# Patient Record
Sex: Male | Born: 1960 | Race: White | Hispanic: No | Marital: Single | Smoking: Never smoker
Health system: Southern US, Community
[De-identification: ages and names within clinical notes are randomized; demographics above are authoritative.]

## PROBLEM LIST (undated history)

## (undated) DIAGNOSIS — IMO0001 Reserved for inherently not codable concepts without codable children: Secondary | ICD-10-CM

## (undated) DIAGNOSIS — Z639 Problem related to primary support group, unspecified: Secondary | ICD-10-CM

## (undated) DIAGNOSIS — G4700 Insomnia, unspecified: Secondary | ICD-10-CM

## (undated) DIAGNOSIS — F32A Depression, unspecified: Secondary | ICD-10-CM

## (undated) DIAGNOSIS — K219 Gastro-esophageal reflux disease without esophagitis: Secondary | ICD-10-CM

## (undated) DIAGNOSIS — M1990 Unspecified osteoarthritis, unspecified site: Secondary | ICD-10-CM

## (undated) DIAGNOSIS — G250 Essential tremor: Secondary | ICD-10-CM

## (undated) DIAGNOSIS — F419 Anxiety disorder, unspecified: Secondary | ICD-10-CM

## (undated) DIAGNOSIS — R42 Dizziness and giddiness: Secondary | ICD-10-CM

## (undated) HISTORY — DX: Anxiety disorder, unspecified: F41.9

## (undated) HISTORY — DX: Gastro-esophageal reflux disease without esophagitis: K21.9

## (undated) HISTORY — DX: Unspecified osteoarthritis, unspecified site: M19.90

## (undated) HISTORY — DX: Insomnia, unspecified: G47.00

## (undated) HISTORY — DX: Essential tremor: G25.0

## (undated) HISTORY — DX: Depression, unspecified: F32.A

## (undated) HISTORY — DX: Dizziness and giddiness: R42

## (undated) HISTORY — DX: Problem related to primary support group, unspecified: Z63.9

---

## 2004-12-26 ENCOUNTER — Emergency Department (HOSPITAL_COMMUNITY): Admission: EM | Admit: 2004-12-26 | Discharge: 2004-12-26 | Payer: Self-pay | Admitting: Emergency Medicine

## 2008-06-26 ENCOUNTER — Emergency Department (HOSPITAL_COMMUNITY): Admission: EM | Admit: 2008-06-26 | Discharge: 2008-06-26 | Payer: Self-pay | Admitting: Emergency Medicine

## 2010-03-20 ENCOUNTER — Emergency Department (HOSPITAL_COMMUNITY): Admission: EM | Admit: 2010-03-20 | Discharge: 2010-03-20 | Payer: Self-pay | Admitting: Family Medicine

## 2010-03-21 ENCOUNTER — Emergency Department (HOSPITAL_COMMUNITY): Admission: EM | Admit: 2010-03-21 | Discharge: 2010-03-21 | Payer: Self-pay | Admitting: Emergency Medicine

## 2012-04-03 ENCOUNTER — Emergency Department (HOSPITAL_COMMUNITY): Payer: Self-pay

## 2012-04-03 ENCOUNTER — Observation Stay (HOSPITAL_COMMUNITY): Payer: Self-pay

## 2012-04-03 ENCOUNTER — Observation Stay (HOSPITAL_COMMUNITY): Payer: Self-pay | Attending: General Surgery

## 2012-04-03 ENCOUNTER — Observation Stay (HOSPITAL_COMMUNITY)
Admission: EM | Admit: 2012-04-03 | Discharge: 2012-04-05 | Disposition: A | Discharge status: Home / Self Care | Payer: Self-pay | Source: Ambulatory Visit | Attending: Emergency Medicine | Admitting: Emergency Medicine

## 2012-04-03 ENCOUNTER — Encounter (HOSPITAL_COMMUNITY): Payer: Self-pay | Admitting: Emergency Medicine

## 2012-04-03 DIAGNOSIS — F10929 Alcohol use, unspecified with intoxication, unspecified: Secondary | ICD-10-CM | POA: Diagnosis present

## 2012-04-03 DIAGNOSIS — D62 Acute posthemorrhagic anemia: Secondary | ICD-10-CM | POA: Diagnosis not present

## 2012-04-03 DIAGNOSIS — S060X9A Concussion with loss of consciousness of unspecified duration, initial encounter: Secondary | ICD-10-CM

## 2012-04-03 DIAGNOSIS — S060X0A Concussion without loss of consciousness, initial encounter: Secondary | ICD-10-CM | POA: Insufficient documentation

## 2012-04-03 DIAGNOSIS — F1010 Alcohol abuse, uncomplicated: Secondary | ICD-10-CM | POA: Insufficient documentation

## 2012-04-03 DIAGNOSIS — IMO0001 Reserved for inherently not codable concepts without codable children: Secondary | ICD-10-CM | POA: Insufficient documentation

## 2012-04-03 DIAGNOSIS — S41109A Unspecified open wound of unspecified upper arm, initial encounter: Secondary | ICD-10-CM

## 2012-04-03 DIAGNOSIS — S060XAA Concussion with loss of consciousness status unknown, initial encounter: Secondary | ICD-10-CM | POA: Diagnosis present

## 2012-04-03 DIAGNOSIS — S0990XA Unspecified injury of head, initial encounter: Secondary | ICD-10-CM

## 2012-04-03 DIAGNOSIS — Z23 Encounter for immunization: Secondary | ICD-10-CM | POA: Insufficient documentation

## 2012-04-03 DIAGNOSIS — Y92410 Unspecified street and highway as the place of occurrence of the external cause: Secondary | ICD-10-CM | POA: Insufficient documentation

## 2012-04-03 DIAGNOSIS — S41111A Laceration without foreign body of right upper arm, initial encounter: Secondary | ICD-10-CM | POA: Diagnosis present

## 2012-04-03 HISTORY — DX: Reserved for inherently not codable concepts without codable children: IMO0001

## 2012-04-03 LAB — RAPID URINE DRUG SCREEN, HOSP PERFORMED
Amphetamines: NOT DETECTED
Benzodiazepines: NOT DETECTED

## 2012-04-03 LAB — URINALYSIS, MICROSCOPIC ONLY
Bilirubin Urine: NEGATIVE
Glucose, UA: NEGATIVE mg/dL
Ketones, ur: NEGATIVE mg/dL
pH: 5 (ref 5.0–8.0)

## 2012-04-03 LAB — CDS SEROLOGY

## 2012-04-03 LAB — CBC
HCT: 36.9 % — ABNORMAL LOW (ref 39.0–52.0)
Hemoglobin: 13 g/dL (ref 13.0–17.0)
MCH: 33.2 pg (ref 26.0–34.0)
MCV: 94.1 fL (ref 78.0–100.0)
RBC: 3.92 MIL/uL — ABNORMAL LOW (ref 4.22–5.81)

## 2012-04-03 LAB — POCT I-STAT, CHEM 8
BUN: 15 mg/dL (ref 6–23)
Calcium, Ion: 1.09 mmol/L — ABNORMAL LOW (ref 1.12–1.32)
Creatinine, Ser: 1.3 mg/dL (ref 0.50–1.35)
Hemoglobin: 13.3 g/dL (ref 13.0–17.0)
TCO2: 19 mmol/L (ref 0–100)

## 2012-04-03 LAB — TYPE AND SCREEN: Unit division: 0

## 2012-04-03 LAB — COMPREHENSIVE METABOLIC PANEL
AST: 27 U/L (ref 0–37)
Albumin: 4.4 g/dL (ref 3.5–5.2)
Calcium: 8.8 mg/dL (ref 8.4–10.5)
Chloride: 101 mEq/L (ref 96–112)
Creatinine, Ser: 0.9 mg/dL (ref 0.50–1.35)
Total Bilirubin: 0.2 mg/dL — ABNORMAL LOW (ref 0.3–1.2)
Total Protein: 7.2 g/dL (ref 6.0–8.3)

## 2012-04-03 LAB — PROTIME-INR: Prothrombin Time: 14 seconds (ref 11.6–15.2)

## 2012-04-03 LAB — ETHANOL: Alcohol, Ethyl (B): 272 mg/dL — ABNORMAL HIGH (ref 0–11)

## 2012-04-03 LAB — LACTIC ACID, PLASMA: Lactic Acid, Venous: 3.2 mmol/L — ABNORMAL HIGH (ref 0.5–2.2)

## 2012-04-03 MED ORDER — OXYCODONE HCL 5 MG PO TABS
5.0000 mg | ORAL_TABLET | ORAL | Status: DC | PRN
  Administered 2012-04-03 – 2012-04-04 (×2): 10 mg via ORAL
  Filled 2012-04-03 (×2): qty 2

## 2012-04-03 MED ORDER — HYDROMORPHONE HCL PF 1 MG/ML IJ SOLN
1.0000 mg | INTRAMUSCULAR | Status: DC | PRN
  Administered 2012-04-03 – 2012-04-04 (×4): 1 mg via INTRAVENOUS
  Filled 2012-04-03 (×5): qty 1

## 2012-04-03 MED ORDER — PANTOPRAZOLE SODIUM 40 MG IV SOLR
40.0000 mg | Freq: Every day | INTRAVENOUS | Status: DC
  Filled 2012-04-03 (×2): qty 40

## 2012-04-03 MED ORDER — ALUM & MAG HYDROXIDE-SIMETH 200-200-20 MG/5ML PO SUSP
30.0000 mL | Freq: Four times a day (QID) | ORAL | Status: DC | PRN
  Administered 2012-04-03: 30 mL via ORAL
  Filled 2012-04-03: qty 30

## 2012-04-03 MED ORDER — PROMETHAZINE HCL 25 MG/ML IJ SOLN
12.5000 mg | Freq: Four times a day (QID) | INTRAMUSCULAR | Status: DC | PRN
  Filled 2012-04-03: qty 1

## 2012-04-03 MED ORDER — VITAMIN B-1 100 MG PO TABS
100.0000 mg | ORAL_TABLET | Freq: Every day | ORAL | Status: DC
  Administered 2012-04-03 – 2012-04-05 (×3): 100 mg via ORAL
  Filled 2012-04-03 (×3): qty 1

## 2012-04-03 MED ORDER — POTASSIUM CHLORIDE CRYS ER 20 MEQ PO TBCR
20.0000 meq | EXTENDED_RELEASE_TABLET | Freq: Three times a day (TID) | ORAL | Status: AC
  Administered 2012-04-03 (×3): 20 meq via ORAL
  Filled 2012-04-03 (×3): qty 1

## 2012-04-03 MED ORDER — HYDROMORPHONE HCL PF 1 MG/ML IJ SOLN
0.5000 mg | INTRAMUSCULAR | Status: DC | PRN
  Administered 2012-04-03 (×3): 0.5 mg via INTRAVENOUS
  Filled 2012-04-03 (×3): qty 1

## 2012-04-03 MED ORDER — ONDANSETRON HCL 4 MG PO TABS: 4.0000 mg | ORAL_TABLET | Freq: Four times a day (QID) | ORAL | Status: DC | PRN

## 2012-04-03 MED ORDER — FENTANYL CITRATE 0.05 MG/ML IJ SOLN
INTRAMUSCULAR | Status: AC
  Administered 2012-04-03: 100 ug via INTRAVENOUS
  Filled 2012-04-03: qty 2

## 2012-04-03 MED ORDER — PANTOPRAZOLE SODIUM 40 MG PO TBEC
40.0000 mg | DELAYED_RELEASE_TABLET | Freq: Every day | ORAL | Status: DC
  Administered 2012-04-03: 40 mg via ORAL
  Filled 2012-04-03: qty 1

## 2012-04-03 MED ORDER — ADULT MULTIVITAMIN W/MINERALS CH
1.0000 | ORAL_TABLET | Freq: Every day | ORAL | Status: DC
  Administered 2012-04-03 – 2012-04-05 (×3): 1 via ORAL
  Filled 2012-04-03 (×3): qty 1

## 2012-04-03 MED ORDER — ONDANSETRON HCL 4 MG/2ML IJ SOLN
4.0000 mg | Freq: Four times a day (QID) | INTRAMUSCULAR | Status: DC | PRN
  Administered 2012-04-03 – 2012-04-04 (×5): 4 mg via INTRAVENOUS
  Filled 2012-04-03 (×5): qty 2

## 2012-04-03 MED ORDER — DEXTROSE IN LACTATED RINGERS 5 % IV SOLN
INTRAVENOUS | Status: DC
  Administered 2012-04-03 (×3): via INTRAVENOUS

## 2012-04-03 MED ORDER — TETANUS-DIPHTH-ACELL PERTUSSIS 5-2.5-18.5 LF-MCG/0.5 IM SUSP
0.5000 mL | Freq: Once | INTRAMUSCULAR | Status: AC
  Administered 2012-04-03: 0.5 mL via INTRAMUSCULAR
  Filled 2012-04-03: qty 0.5

## 2012-04-03 MED ORDER — OXYCODONE HCL 5 MG PO TABS
5.0000 mg | ORAL_TABLET | ORAL | Status: DC | PRN
  Administered 2012-04-03 (×2): 5 mg via ORAL
  Filled 2012-04-03 (×2): qty 1

## 2012-04-03 MED ORDER — FOLIC ACID 1 MG PO TABS
1.0000 mg | ORAL_TABLET | Freq: Every day | ORAL | Status: DC
  Administered 2012-04-03 – 2012-04-05 (×3): 1 mg via ORAL
  Filled 2012-04-03 (×3): qty 1

## 2012-04-03 MED ORDER — LORAZEPAM 1 MG PO TABS
1.0000 mg | ORAL_TABLET | Freq: Four times a day (QID) | ORAL | Status: DC | PRN
  Administered 2012-04-03: 1 mg via ORAL
  Filled 2012-04-03 (×2): qty 1

## 2012-04-03 MED ORDER — TETANUS-DIPHTHERIA TOXOIDS TD 5-2 LFU IM INJ: 0.5000 mL | INJECTION | Freq: Once | INTRAMUSCULAR | Status: DC

## 2012-04-03 MED ORDER — THIAMINE HCL 100 MG/ML IJ SOLN
100.0000 mg | Freq: Every day | INTRAMUSCULAR | Status: DC
  Filled 2012-04-03 (×3): qty 1

## 2012-04-03 MED ORDER — IOHEXOL 300 MG/ML  SOLN
100.0000 mL | Freq: Once | INTRAMUSCULAR | Status: AC | PRN
  Administered 2012-04-03: 100 mL via INTRAVENOUS

## 2012-04-03 MED ORDER — LORAZEPAM 2 MG/ML IJ SOLN: 1.0000 mg | Freq: Four times a day (QID) | INTRAMUSCULAR | Status: DC | PRN
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
## 2012-04-03 NOTE — ED Notes (Signed)
Patient was transported to CT on portable cardiac monitor with RN and nurse tech; patient now back from CT.  Moved to room 02 (OKed by Dr. Janee Morn).

## 2012-04-03 NOTE — Procedures (Signed)
Procedure note Preoperative diagnosis: 10 cm laceration right posterior arm Postoperative diagnosis: 10 cm laceration right posterior arm Procedure: Irrigation and simple closure 10 cm laceration right posterior arm Surgeon: Violeta Gelinas M.D. Anesthesia: Local Procedure: Emergency consent was obtained, x-ray reviewed and show no fracture. Laceration was copiously irrigated with Betadine and saline. Laceration was in a wide V-shaped over the triceps. There is minimal injury to the triceps fascia but no significant disruption. No active bleeding. Wound was closed with staples and a simple fashion. Next several long wicks of quarter inch iodoform gauze were placed. Next a sterile gauze and Kerlix dressing was applied. Patient tolerated procedure well. Violeta Gelinas, MD, MPH, FACS Pager: [phone]

## 2012-04-03 NOTE — ED Notes (Signed)
X-ray at bedside

## 2012-04-03 NOTE — ED Notes (Signed)
Patient being transported upstairs on portable cardiac monitor with RN. 

## 2012-04-03 NOTE — ED Notes (Signed)
Dr. Janee Morn at bedside with suture materials.
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
## 2012-04-03 NOTE — ED Notes (Signed)
Patient currently asleep in bed; no respiratory or acute distress noted.  Will continue to monitor. 

## 2012-04-03 NOTE — ED Notes (Signed)
Calling report now. 
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
## 2012-04-04 DIAGNOSIS — F10929 Alcohol use, unspecified with intoxication, unspecified: Secondary | ICD-10-CM | POA: Diagnosis present

## 2012-04-04 DIAGNOSIS — S41111A Laceration without foreign body of right upper arm, initial encounter: Secondary | ICD-10-CM | POA: Diagnosis present

## 2012-04-04 DIAGNOSIS — S060XAA Concussion with loss of consciousness status unknown, initial encounter: Secondary | ICD-10-CM | POA: Diagnosis present

## 2012-04-04 DIAGNOSIS — D62 Acute posthemorrhagic anemia: Secondary | ICD-10-CM | POA: Diagnosis not present

## 2012-04-04 LAB — CBC
HCT: 30.2 % — ABNORMAL LOW (ref 39.0–52.0)
MCHC: 34.4 g/dL (ref 30.0–36.0)
MCV: 96.2 fL (ref 78.0–100.0)
RDW: 12.6 % (ref 11.5–15.5)

## 2012-04-04 LAB — BASIC METABOLIC PANEL
BUN: 8 mg/dL (ref 6–23)
CO2: 27 mEq/L (ref 19–32)
Chloride: 102 mEq/L (ref 96–112)
Creatinine, Ser: 0.7 mg/dL (ref 0.50–1.35)
GFR calc Af Amer: >90 mL/min (ref >90)

## 2012-04-04 MED ORDER — ENOXAPARIN SODIUM 30 MG/0.3ML ~~LOC~~ SOLN
30.0000 mg | Freq: Two times a day (BID) | SUBCUTANEOUS | Status: DC
  Administered 2012-04-04 – 2012-04-05 (×3): 30 mg via SUBCUTANEOUS
  Filled 2012-04-04 (×4): qty 0.3

## 2012-04-04 MED ORDER — HYDROMORPHONE HCL PF 1 MG/ML IJ SOLN
0.5000 mg | INTRAMUSCULAR | Status: DC | PRN
  Administered 2012-04-04: 0.5 mg via INTRAVENOUS
  Filled 2012-04-04: qty 1

## 2012-04-04 MED ORDER — OXYCODONE HCL 5 MG PO TABS
5.0000 mg | ORAL_TABLET | ORAL | Status: DC | PRN
  Administered 2012-04-04: 10 mg via ORAL
  Administered 2012-04-04 – 2012-04-05 (×6): 15 mg via ORAL
  Filled 2012-04-04 (×6): qty 3
  Filled 2012-04-04: qty 2

## 2012-04-04 MED ORDER — BACITRACIN ZINC 500 UNIT/GM EX OINT
1.0000 "application " | TOPICAL_OINTMENT | Freq: Two times a day (BID) | CUTANEOUS | Status: DC
  Administered 2012-04-04 – 2012-04-05 (×3): 1 via TOPICAL
  Filled 2012-04-04: qty 15
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
## 2012-04-04 NOTE — Progress Notes (Signed)
PT Cancellation Note  Treatment cancelled today due to patient just eating breakfast, PT to check back later this PM for gait and mobility.  Patient is agreeable.  [name] B. Zarinah Oviatt, PT, DPT [phone] 04/04/2012, 12:05 PM
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
## 2012-04-04 NOTE — Progress Notes (Signed)
Patient is becoming more mobile.  CPM.  This patient has been seen and I agree with the findings and treatment plan.  Marta Lamas. Gae Bon, MD, FACS [phone] (pager) [phone] (direct pager) Trauma Surgeon

## 2012-04-05 LAB — CBC
MCH: 32.6 pg (ref 26.0–34.0)
MCHC: 33.4 g/dL (ref 30.0–36.0)
Platelets: 292 10*3/uL (ref 150–400)
RDW: 12.4 % (ref 11.5–15.5)

## 2012-04-05 MED ORDER — OXYCODONE-ACETAMINOPHEN 7.5-325 MG PO TABS: 1.0000 | ORAL_TABLET | ORAL | Status: AC | PRN

## 2012-04-05 NOTE — Discharge Instructions (Signed)
Wash wound daily in shower with soap and water.  Do not soak. Apply antibiotic ointment twice daily and as needed to keep moist. Cover with a dry dressing.  No driving while on Percocet (oxycodone).

## 2012-04-05 NOTE — Discharge Summary (Signed)
SL x 2 removed, no bleeding noted, catheter tips intact.  Pt seen by Phsical Therapy and cleared.  Pt given d/c instructionsl along with f/u apt to be made and prescription for percocet.  Pt denies any needs.  Pt d/c'd home via w/c accompanied by medical staff and significant other.

## 2012-04-05 NOTE — Progress Notes (Signed)
UR complete 

## 2012-04-05 NOTE — Progress Notes (Signed)
Physical Therapy Treatment Patient Details Name: Jeffery Goodman MRN: [id number] DOB: 1961-10-27 Today's Date: 04/05/2012  PT Assessment/Plan  PT - Assessment/Plan Comments on Treatment Session: Pt amb WNL. Pt had no isssues with LOB or lightheadness with dynamic gait activities during amb.  PT Plan: All goals met and education completed, patient dischaged from PT services PT Frequency: Min 5X/week Follow Up Recommendations: No PT follow up Equipment Recommended: None recommended by PT PT Goals  Acute Rehab PT Goals PT Goal: Supine/Side to Sit - Progress: Met PT Goal: Sit to Supine/Side - Progress: Met PT Goal: Sit to Stand - Progress: Met PT Goal: Stand to Sit - Progress: Met PT Transfer Goal: Bed to Chair/Chair to Bed - Progress: Met PT Goal: Ambulate - Progress: Met PT Goal: Up/Down Stairs - Progress: Met  PT Treatment Precautions/Restrictions  Precautions Precautions: Fall Restrictions Weight Bearing Restrictions: No Mobility (including Balance) Bed Mobility Supine to Sit: 7: Independent Sitting - Scoot to Edge of Bed: 7: Independent Transfers Transfers: Yes Sit to Stand: 7: Independent Stand to Sit: 7: Independent Ambulation/Gait Ambulation/Gait: Yes Ambulation/Gait Assistance Details (indicate cue type and reason): Pt amb with no LOB or lightheadness.  Ambulation Distance (Feet): 400 Feet Assistive device: None Gait Pattern: Within Functional Limits;Step-through pattern Stairs: Yes Stair Management Technique: No rails Number of Stairs: 10  (5x2 in gym ) Wheelchair Mobility Wheelchair Mobility: No  End of Session PT - End of Session Activity Tolerance: Patient tolerated treatment well Patient left: in chair;with call bell in reach Nurse Communication: Mobility status for transfers;Mobility status for ambulation General Behavior During Session: [facility] for tasks performed Cognition: [facility] for tasks performed  Tamera Stands 04/05/2012, 12:13 PM

## 2012-04-05 NOTE — Progress Notes (Signed)
Patient examined and I agree with the assessment and plan  Violeta Gelinas, MD, MPH, FACS Pager: [phone]  04/05/2012 10:43 AM
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
## 2012-04-05 NOTE — Discharge Summary (Signed)
Ranier Coach, MD, MPH, FACS Pager: [phone]  
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
## 2012-04-14 ENCOUNTER — Encounter (INDEPENDENT_AMBULATORY_CARE_PROVIDER_SITE_OTHER): Payer: Self-pay

## 2012-04-14 ENCOUNTER — Encounter (INDEPENDENT_AMBULATORY_CARE_PROVIDER_SITE_OTHER): Payer: Self-pay | Admitting: Orthopedic Surgery

## 2012-04-14 ENCOUNTER — Ambulatory Visit (INDEPENDENT_AMBULATORY_CARE_PROVIDER_SITE_OTHER): Payer: Self-pay | Admitting: Orthopedic Surgery

## 2012-04-14 VITALS — BP 122/86 | HR 84 | Ht 66.0 in | Wt 197.2 lb

## 2012-04-14 DIAGNOSIS — G5622 Lesion of ulnar nerve, left upper limb: Secondary | ICD-10-CM

## 2012-04-14 DIAGNOSIS — G5620 Lesion of ulnar nerve, unspecified upper limb: Secondary | ICD-10-CM

## 2012-04-14 MED ORDER — NAPROXEN 500 MG PO TABS: 500.0000 mg | ORAL_TABLET | Freq: Two times a day (BID) | ORAL | Status: AC

## 2012-04-14 MED ORDER — TRAMADOL HCL 50 MG PO TABS: 50.0000 mg | ORAL_TABLET | Freq: Four times a day (QID) | ORAL | Status: AC | PRN

## 2012-04-14 MED ORDER — PREGABALIN 75 MG PO CAPS: 75.0000 mg | ORAL_CAPSULE | Freq: Two times a day (BID) | ORAL | Status: DC
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
## 2012-04-19 ENCOUNTER — Ambulatory Visit (HOSPITAL_COMMUNITY)
Admission: RE | Admit: 2012-04-19 | Discharge: 2012-04-19 | Disposition: A | Discharge status: Home / Self Care | Payer: Self-pay | Source: Ambulatory Visit | Attending: Orthopedic Surgery | Admitting: Orthopedic Surgery

## 2012-04-19 DIAGNOSIS — M47812 Spondylosis without myelopathy or radiculopathy, cervical region: Secondary | ICD-10-CM | POA: Insufficient documentation

## 2012-04-19 DIAGNOSIS — G5622 Lesion of ulnar nerve, left upper limb: Secondary | ICD-10-CM

## 2012-05-03 ENCOUNTER — Telehealth: Payer: Self-pay | Admitting: Orthopedic Surgery

## 2012-05-03 NOTE — Telephone Encounter (Signed)
Patient called for appointment.

## 2012-05-04 ENCOUNTER — Ambulatory Visit: Payer: Self-pay | Attending: Orthopedic Surgery | Admitting: Physical Therapy

## 2012-05-04 ENCOUNTER — Telehealth: Payer: Self-pay | Admitting: Orthopedic Surgery

## 2012-05-04 DIAGNOSIS — R42 Dizziness and giddiness: Secondary | ICD-10-CM | POA: Insufficient documentation

## 2012-05-04 DIAGNOSIS — IMO0001 Reserved for inherently not codable concepts without codable children: Secondary | ICD-10-CM | POA: Insufficient documentation

## 2012-05-04 NOTE — Telephone Encounter (Signed)
Called because he is still dizzy but has not been to PT yet. Was there when I called trying to get an appointment. I also informed him about missing his appointment with Dr. Yetta Barre and he is going to call and reschedule. He will call me back if the dizziness does not improve with PT.

## 2012-05-11 ENCOUNTER — Ambulatory Visit: Payer: Self-pay | Admitting: Physical Therapy

## 2012-05-17 ENCOUNTER — Ambulatory Visit: Payer: Self-pay | Admitting: Physical Therapy

## 2012-05-18 ENCOUNTER — Telehealth: Payer: Self-pay | Admitting: Orthopedic Surgery

## 2012-05-18 NOTE — Telephone Encounter (Signed)
Called for clinic appointment.

## 2012-05-20 ENCOUNTER — Telehealth: Payer: Self-pay | Admitting: Orthopedic Surgery

## 2012-05-20 NOTE — Telephone Encounter (Signed)
I spoke with the physical therapist who has been treating Jeffery Goodman. She is only addressing his dizziness currently as she's not sure what she should or should not do about his neck. She says he can't see Dr. Yetta Barre because of cost. I told her that I had nothing more to offer and that would need to be the next step. She said she would communicate that to him at his next appointment.

## 2012-05-24 ENCOUNTER — Encounter: Payer: Self-pay | Admitting: Physical Therapy

## 2012-05-31 ENCOUNTER — Ambulatory Visit: Payer: Self-pay | Attending: Orthopedic Surgery | Admitting: Physical Therapy

## 2012-05-31 DIAGNOSIS — R42 Dizziness and giddiness: Secondary | ICD-10-CM | POA: Insufficient documentation

## 2012-05-31 DIAGNOSIS — IMO0001 Reserved for inherently not codable concepts without codable children: Secondary | ICD-10-CM | POA: Insufficient documentation

## 2015-01-06 ENCOUNTER — Other Ambulatory Visit: Payer: Self-pay | Admitting: Chiropractic Medicine

## 2015-01-06 ENCOUNTER — Ambulatory Visit (HOSPITAL_COMMUNITY)
Admission: RE | Admit: 2015-01-06 | Discharge: 2015-01-06 | Disposition: A | Discharge status: Home / Self Care | Payer: Self-pay | Source: Ambulatory Visit | Attending: Chiropractic Medicine | Admitting: Chiropractic Medicine

## 2015-01-06 DIAGNOSIS — M542 Cervicalgia: Secondary | ICD-10-CM

## 2015-01-06 DIAGNOSIS — M5032 Other cervical disc degeneration, mid-cervical region: Secondary | ICD-10-CM | POA: Insufficient documentation

## 2015-01-17 ENCOUNTER — Other Ambulatory Visit (HOSPITAL_COMMUNITY): Payer: Self-pay | Admitting: Chiropractic Medicine

## 2015-01-17 DIAGNOSIS — M542 Cervicalgia: Secondary | ICD-10-CM

## 2015-01-25 ENCOUNTER — Ambulatory Visit (HOSPITAL_COMMUNITY): Admission: RE | Admit: 2015-01-25 | Payer: Self-pay | Source: Ambulatory Visit

## 2016-02-22 ENCOUNTER — Ambulatory Visit (INDEPENDENT_AMBULATORY_CARE_PROVIDER_SITE_OTHER): Payer: Self-pay | Admitting: Urgent Care

## 2016-02-22 ENCOUNTER — Emergency Department (HOSPITAL_BASED_OUTPATIENT_CLINIC_OR_DEPARTMENT_OTHER)
Admission: EM | Admit: 2016-02-22 | Discharge: 2016-02-22 | Disposition: A | Discharge status: Home / Self Care | Payer: Self-pay | Attending: Emergency Medicine | Admitting: Emergency Medicine

## 2016-02-22 ENCOUNTER — Encounter (HOSPITAL_BASED_OUTPATIENT_CLINIC_OR_DEPARTMENT_OTHER): Payer: Self-pay | Admitting: *Deleted

## 2016-02-22 VITALS — BP 110/72 | HR 60 | Temp 98.5°F | Resp 16 | Ht 66.0 in | Wt 206.0 lb

## 2016-02-22 DIAGNOSIS — S0592XA Unspecified injury of left eye and orbit, initial encounter: Secondary | ICD-10-CM

## 2016-02-22 DIAGNOSIS — W228XXA Striking against or struck by other objects, initial encounter: Secondary | ICD-10-CM | POA: Insufficient documentation

## 2016-02-22 DIAGNOSIS — T1502XA Foreign body in cornea, left eye, initial encounter: Secondary | ICD-10-CM | POA: Insufficient documentation

## 2016-02-22 DIAGNOSIS — Y9289 Other specified places as the place of occurrence of the external cause: Secondary | ICD-10-CM | POA: Insufficient documentation

## 2016-02-22 DIAGNOSIS — H5712 Ocular pain, left eye: Secondary | ICD-10-CM

## 2016-02-22 DIAGNOSIS — Y990 Civilian activity done for income or pay: Secondary | ICD-10-CM | POA: Insufficient documentation

## 2016-02-22 DIAGNOSIS — S0990XA Unspecified injury of head, initial encounter: Secondary | ICD-10-CM | POA: Insufficient documentation

## 2016-02-22 DIAGNOSIS — Y9389 Activity, other specified: Secondary | ICD-10-CM | POA: Insufficient documentation

## 2016-02-22 DIAGNOSIS — J3489 Other specified disorders of nose and nasal sinuses: Secondary | ICD-10-CM | POA: Insufficient documentation

## 2016-02-22 DIAGNOSIS — T1592XA Foreign body on external eye, part unspecified, left eye, initial encounter: Secondary | ICD-10-CM

## 2016-02-22 MED ORDER — FLUORESCEIN SODIUM 1 MG OP STRP
ORAL_STRIP | OPHTHALMIC | Status: AC
  Filled 2016-02-22: qty 1

## 2016-02-22 MED ORDER — TETANUS-DIPHTH-ACELL PERTUSSIS 5-2.5-18.5 LF-MCG/0.5 IM SUSP: 0.5000 mL | Freq: Once | INTRAMUSCULAR | Status: DC

## 2016-02-22 MED ORDER — TETRACAINE HCL 0.5 % OP SOLN
OPHTHALMIC | Status: AC
  Filled 2016-02-22: qty 4

## 2016-02-22 NOTE — Progress Notes (Signed)
    MRN: [id number] DOB: 04-Jun-1961  Subjective:   Jeffery Goodman is a 55 y.o. male presenting for chief complaint of Eye Injury  Reports 3 day history of left eye injury while at work. Patient was working with rusted metal, a piece broke off and rust struck his eye. Since then, he has had worsening eye pain, photosensitivity, eye swelling, headache. He has not sought medical care. 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 Denies bleeding, drainage from his eye.   Jeffery Goodman has a current medication list which includes the following Facility-Administered Medications: fluorescein and tetracaine. Also has No Known Allergies.  Jeffery Goodman  has a past medical history of No significant past medical history. Also  has no past surgical history on file.  Objective:   Vitals: BP 110/72 mmHg  Pulse 60  Temp(Src) 98.5 F (36.9 C) (Oral)  Resp 16  Ht 5\' 6"  (1.676 m)  Wt 206 lb (93.441 kg)  BMI 33.27 kg/m2  SpO2 98%  Physical Exam  Constitutional: He is oriented to person, place, and time. He appears well-developed and well-nourished.  Eyes: Foreign body present in the left eye.    Cardiovascular: Normal rate.   Pulmonary/Chest: Effort normal.  Neurological: He is alert and oriented to person, place, and time.  Skin: Skin is warm and dry.   PROCEDURE NOTE: eye exam Verbal consent obtained. Eyelids everted and swept for foreign body. The eye was anesthetized with 2 drops of proparacaine and stained with fluorescein. Examination under woods lamp does reveal a rust ring over his cornea as depicted. The eye was then irrigated copiously with saline.   Assessment and Plan :   1. Left eye injury, initial encounter 2. Foreign body, eye, left, initial encounter 3. Left eye pain - We do not have the equipment to remove the foreign body from his eye. Patient was advised to report to the [facility] for emergent removal of the foreign body.  He verbalized understanding and agreed to report there today.  Wallis BambergMario Donnice Nielsen, PA-C [facility] [phone] 02/22/2016 5:08 PM

## 2016-02-22 NOTE — Patient Instructions (Signed)
Eye Foreign Body  A foreign body refers to any object on the surface of the eye or in the eyeball that should not be there. A foreign body may be a small speck of dirt or dust, a hair or eyelash, a splinter, or any other object.   SIGNS AND SYMPTOMS  Symptoms depend on what the foreign body is and where it is in the eye. The most common locations are:    On the inner surface of the upper or lower eyelids or on the covering of the white part of the eye (conjunctiva). Symptoms in this location are:    Pain and irritation, especially when blinking.    The feeling that something is in the eye.   On the surface of the clear covering on the front of the eye (cornea). Symptoms in this location include:    Pain and irritation.     Small "rust rings" around a metallic foreign body.    The feeling that something is in the eye.    Inside the eyeball. Foreign bodies inside the eye may cause:     Great pain.     Immediate loss of vision.     Distortion of the pupil.  DIAGNOSIS   Foreign bodies are found during an exam by an eye specialist. Those on the eyelids, conjunctiva, or cornea are usually (but not always) easily found. When a foreign body is inside the eyeball, a cloudiness of the lens (cataract) may form almost right away. This makes it hard for an eye specialist to find the foreign body. Tests may be needed, including ultrasound testing, X-rays, and CT scans.  TREATMENT    Foreign bodies on the eyelids, conjunctiva, or cornea are often removed easily and painlessly.   Rust in the cornea may require the use of a drill-like instrument to remove the rust.   If the foreign body has caused a scratch or a rubbing or scraping (abrasion) of the cornea, this may be treated with antibiotic drops or ointment. A pressure patch may be put over your eye.   If the foreign body is inside your eyeball, surgery is needed right away. This is a medical emergency. Foreign bodies inside the eye threaten vision. A person may even  lose his or her eye.  HOME CARE INSTRUCTIONS    Take medicines only as directed by your health care provider. Use eye drops or ointment as directed.   If no eye patch was applied:    Keep your eye closed as much as possible.    Do not rub your eye.    Wear dark glasses as needed to protect your eyes from bright light.    Do not wear contact lenses until your eye feels normal again, or as instructed by your health care provider.    Wear a protective eye covering if there is a risk of eye injury. This is important when working with high-speed tools.   If your eye is patched:    Follow your health care provider's instructions for when to remove the patch.    Do notdrive or operate machinery if your eye is patched. Your ability to judge distances is impaired.   Keep all follow-up visits as directed by your health care provider. This is important.  SEEK MEDICAL CARE IF:    You have increased pain in your eye.   Your vision gets worse.    You have problems with your eye patch.    You have fluid (discharge)   coming from your injured eye.    You have redness and swelling around your affected eye.   MAKE SURE YOU:    Understand these instructions.   Will watch your condition.   Will get help right away if you are not doing well or get worse.     This information is not intended to replace advice given to you by your health care provider. Make sure you discuss any questions you have with your health care provider.     Document Released: 12/07/2005 Document Revised: 12/28/2014 Document Reviewed: 05/04/2013  Elsevier Interactive Patient Education 2016 [employer]

## 2016-02-22 NOTE — ED Notes (Signed)
MD at bedside. 

## 2016-02-22 NOTE — Discharge Instructions (Signed)
Eye Foreign Body  A foreign body is an object on or in the eye that should not be there. The object could be a speck of dirt or dust, a hair, an eyelash, a splinter, or any other object.  HOME CARE   Take medicines only as told by your doctor. Use eye drops or ointment as told.   If no eye patch was put on:   Keep the eye closed as much as possible.   Do not rub the eye.   Wear dark glasses in bright light.   Do not wear contact lenses until the eye feels normal, or as told by your doctor.   Wear protective eye covering when needed, especially when using high-speed tools.   If your eye is patched:   Follow your doctor's instructions for when to remove the patch.   Do notdrive or use machines while the eye patch is on. Judging distances is hard to do while wearing a patch.   Keep all follow-up visits as told by your doctor. This is important.  GET HELP IF:    Your pain gets worse.   Your vision gets worse.   You have problems with your eye patch.   You have fluid (discharge) coming from your eye.   You have redness and swelling around your eye.  MAKE SURE YOU:    Understand these instructions.   Will watch your condition.   Will get help right away if you are not doing well or get worse.     This information is not intended to replace advice given to you by your health care provider. Make sure you discuss any questions you have with your health care provider.     Document Released: 05/27/2010 Document Revised: 12/28/2014 Document Reviewed: 05/04/2013  Elsevier Interactive Patient Education 2016 [employer]

## 2016-02-22 NOTE — ED Notes (Signed)
Pt reports "rust" in left eye since Thursday. Seen at [facility] and sent here for further evaluation

## 2016-02-22 NOTE — ED Provider Notes (Signed)
CSN: [id number]     Arrival date & time 02/22/16  1529 History  By signing my name below, I, Jeffery Goodman, attest that this documentation has been prepared under the direction and in the presence of Rolan Bucco, MD. Electronically Signed: Bethel Goodman, ED Scribe. 02/22/2016. 4:09 PM   Chief Complaint  Patient presents with  . Eye Pain   The history is provided by the patient. No language interpreter was used.   Byrd Rushlow is a 55 y.o. male who presents to the Emergency Department complaining of constant, 9/10 in severity, left eye pain with onset 2 days ago. Pt states that he was working on his father's truck and a small piece of rusted metal hit him in the eye. He thinks that he removed the metal from his eye but it still feels as if "something" is in it. He rinsed the eye and applied eye drops at home without significant relief at home. Associated symptoms include tearing from the left eye, blurred vision at the left eye, headache, and rhinorrhea. He was seen at Urgent Care today where the eye was numbed and examined but he was referred to the ED for further evaluation.  Pt does not wear contact lenses and does not have an optometrist.   Past Medical History  Diagnosis Date  . No significant past medical history    History reviewed. No pertinent past surgical history. No family history on file. Social History  Substance Use Topics  . Smoking status: Never Smoker   . Smokeless tobacco: Never Used  . Alcohol Use: Yes     Comment: rare    Review of Systems  Constitutional: Negative for fever.  HENT: Positive for rhinorrhea.   Eyes: Positive for pain, discharge and visual disturbance.  Gastrointestinal: Negative for nausea and vomiting.  Musculoskeletal: Negative for neck pain.  Neurological: Positive for headaches.  All other systems reviewed and are negative.  Allergies  Review of patient's allergies indicates no known allergies.  Home Medications   Prior to  Admission medications   Not on File   BP 137/98 mmHg  Pulse 66  Temp(Src) 98.5 F (36.9 C) (Oral)  Resp 20  Ht  (1.778 m)  Wt 206 lb (93.441 kg)  BMI 29.56 kg/m2  SpO2 100% Physical Exam  Constitutional: He is oriented to person, place, and time. He appears well-developed and well-nourished.  HENT:  Head: Normocephalic.  Left Eye: Generalized conjunctival injection Small foreign body noted to the cornea at the 11 o'clock position Watery discharge  Eyes: EOM are normal. Left conjunctiva is injected.  PT with small FB noted to left cornea at 11 o'clock position.  Neck: Normal range of motion.  Cardiovascular: Normal rate.   Pulmonary/Chest: Effort normal.  Abdominal: He exhibits no distension.  Musculoskeletal: Normal range of motion.  Neurological: He is alert and oriented to person, place, and time.  Skin: Skin is warm and dry.  Psychiatric: He has a normal mood and affect.  Nursing note and vitals reviewed.   ED Course  Procedures (including critical care time) .proc DIAGNOSTIC STUDIES: Oxygen Saturation is 100% on RA,  normal by my interpretation.    COORDINATION OF CARE: 3:55 PM Discussed treatment plan which includes FB removal with pt at bedside and pt agreed to plan.  4:06 PM-Consult complete with Dr. Sherryll Burger (Ophthalmology). Patient case explained and discussed.  Call ended at 4:09 PM   Labs Review Labs Reviewed - No data to display  Imaging Review No results found.  EKG Interpretation None      MDM   Final diagnoses:  Corneal foreign body, left, initial encounter    Patient presents with a corneal foreign body. I was able to remove part of the foreign body with a cotton swab. However there still residual foreign body in place. I consulted the ophthalmologist on-call, Dr. Sherryll BurgerShah, who will see the patient in the office today. I told the patient to go directly to Dr. Margaretmary EddyShah's office for further evaluation. His tetanus shot is up-to-date.  I  personally performed the services described in this documentation, which was scribed in my presence.  The recorded information has been reviewed and considered.     Rolan BuccoMelanie Tesha Archambeau, MD 02/22/16 1655

## 2016-02-22 NOTE — ED Notes (Signed)
To see opthalmology now in office

## 2016-02-29 ENCOUNTER — Emergency Department (HOSPITAL_BASED_OUTPATIENT_CLINIC_OR_DEPARTMENT_OTHER)
Admission: EM | Admit: 2016-02-29 | Discharge: 2016-02-29 | Disposition: A | Discharge status: Home / Self Care | Payer: No Typology Code available for payment source | Attending: Emergency Medicine | Admitting: Emergency Medicine

## 2016-02-29 ENCOUNTER — Encounter (HOSPITAL_BASED_OUTPATIENT_CLINIC_OR_DEPARTMENT_OTHER): Payer: Self-pay | Admitting: *Deleted

## 2016-02-29 DIAGNOSIS — M545 Low back pain, unspecified: Secondary | ICD-10-CM

## 2016-02-29 DIAGNOSIS — Y9389 Activity, other specified: Secondary | ICD-10-CM | POA: Insufficient documentation

## 2016-02-29 DIAGNOSIS — Y92410 Unspecified street and highway as the place of occurrence of the external cause: Secondary | ICD-10-CM | POA: Diagnosis not present

## 2016-02-29 DIAGNOSIS — Y998 Other external cause status: Secondary | ICD-10-CM | POA: Insufficient documentation

## 2016-02-29 DIAGNOSIS — S199XXA Unspecified injury of neck, initial encounter: Secondary | ICD-10-CM | POA: Insufficient documentation

## 2016-02-29 DIAGNOSIS — S3992XA Unspecified injury of lower back, initial encounter: Secondary | ICD-10-CM | POA: Insufficient documentation

## 2016-02-29 DIAGNOSIS — M542 Cervicalgia: Secondary | ICD-10-CM

## 2016-02-29 MED ORDER — IBUPROFEN 800 MG PO TABS
800.0000 mg | ORAL_TABLET | Freq: Once | ORAL | Status: AC
  Administered 2016-02-29: 800 mg via ORAL
  Filled 2016-02-29: qty 1

## 2016-02-29 MED ORDER — IBUPROFEN 800 MG PO TABS: 800.0000 mg | ORAL_TABLET | Freq: Three times a day (TID) | ORAL | Status: DC

## 2016-02-29 MED ORDER — CYCLOBENZAPRINE HCL 10 MG PO TABS: 10.0000 mg | ORAL_TABLET | Freq: Two times a day (BID) | ORAL | Status: DC | PRN

## 2016-02-29 MED ORDER — HYDROCODONE-ACETAMINOPHEN 5-325 MG PO TABS: 1.0000 | ORAL_TABLET | ORAL | Status: DC | PRN
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
## 2016-02-29 NOTE — Discharge Instructions (Signed)
Take your medications as prescribed. I recommend eating food prior to taking her ibuprofen to prevent GI side effects. Apply ice to affected areas for 15-20 minutes 3-4 times daily for pain relief. Refrain from doing any heavy lifting or repetitive movements that exacerbate your symptoms for the next few days. Please follow up with a primary care provider from the Resource Guide provided below in 4-5 days as needed. Please return to the Emergency Department if symptoms worsen or new onset of fever, numbness, tingling, groin anesthesia, loss of bowel or bladder, weakness.

## 2016-02-29 NOTE — ED Notes (Signed)
PA at bedside.

## 2018-09-04 ENCOUNTER — Emergency Department (HOSPITAL_BASED_OUTPATIENT_CLINIC_OR_DEPARTMENT_OTHER): Payer: No Typology Code available for payment source

## 2018-09-04 ENCOUNTER — Encounter (HOSPITAL_COMMUNITY): Payer: Self-pay | Admitting: *Deleted

## 2018-09-04 ENCOUNTER — Emergency Department (HOSPITAL_BASED_OUTPATIENT_CLINIC_OR_DEPARTMENT_OTHER)
Admission: EM | Admit: 2018-09-04 | Discharge: 2018-09-04 | Disposition: A | Discharge status: Home / Self Care | Payer: No Typology Code available for payment source | Attending: Emergency Medicine | Admitting: Emergency Medicine

## 2018-09-04 ENCOUNTER — Emergency Department (HOSPITAL_COMMUNITY)
Admission: EM | Admit: 2018-09-04 | Discharge: 2018-09-04 | Disposition: A | Discharge status: Home / Self Care | Payer: Self-pay | Attending: Emergency Medicine | Admitting: Emergency Medicine

## 2018-09-04 ENCOUNTER — Encounter (HOSPITAL_BASED_OUTPATIENT_CLINIC_OR_DEPARTMENT_OTHER): Payer: Self-pay

## 2018-09-04 ENCOUNTER — Other Ambulatory Visit: Payer: Self-pay

## 2018-09-04 DIAGNOSIS — M25512 Pain in left shoulder: Secondary | ICD-10-CM | POA: Insufficient documentation

## 2018-09-04 DIAGNOSIS — Z5321 Procedure and treatment not carried out due to patient leaving prior to being seen by health care provider: Secondary | ICD-10-CM | POA: Insufficient documentation

## 2018-09-04 DIAGNOSIS — Y92410 Unspecified street and highway as the place of occurrence of the external cause: Secondary | ICD-10-CM | POA: Diagnosis not present

## 2018-09-04 DIAGNOSIS — Y939 Activity, unspecified: Secondary | ICD-10-CM | POA: Insufficient documentation

## 2018-09-04 DIAGNOSIS — S39012A Strain of muscle, fascia and tendon of lower back, initial encounter: Secondary | ICD-10-CM

## 2018-09-04 DIAGNOSIS — M542 Cervicalgia: Secondary | ICD-10-CM | POA: Insufficient documentation

## 2018-09-04 DIAGNOSIS — Y999 Unspecified external cause status: Secondary | ICD-10-CM | POA: Diagnosis not present

## 2018-09-04 DIAGNOSIS — S161XXA Strain of muscle, fascia and tendon at neck level, initial encounter: Secondary | ICD-10-CM | POA: Diagnosis not present

## 2018-09-04 DIAGNOSIS — S199XXA Unspecified injury of neck, initial encounter: Secondary | ICD-10-CM | POA: Diagnosis present

## 2018-09-04 MED ORDER — ACETAMINOPHEN 325 MG PO TABS
650.0000 mg | ORAL_TABLET | Freq: Once | ORAL | Status: AC
  Administered 2018-09-04: 650 mg via ORAL
  Filled 2018-09-04: qty 2

## 2018-09-04 MED ORDER — METHOCARBAMOL 500 MG PO TABS
500.0000 mg | ORAL_TABLET | Freq: Once | ORAL | Status: AC
  Administered 2018-09-04: 500 mg via ORAL
  Filled 2018-09-04: qty 1

## 2018-09-04 MED ORDER — METHOCARBAMOL 500 MG PO TABS: 500.0000 mg | ORAL_TABLET | Freq: Two times a day (BID) | ORAL | 0 refills | Status: DC

## 2018-09-04 MED ORDER — NAPROXEN 500 MG PO TABS: 500.0000 mg | ORAL_TABLET | Freq: Two times a day (BID) | ORAL | 0 refills | Status: DC

## 2018-09-04 NOTE — ED Notes (Signed)
No answer x1

## 2018-09-04 NOTE — ED Notes (Signed)
No answer x3
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
## 2018-09-04 NOTE — ED Notes (Signed)
No answer x2 

## 2019-01-13 ENCOUNTER — Encounter: Payer: Self-pay | Admitting: Internal Medicine

## 2019-01-13 ENCOUNTER — Ambulatory Visit: Payer: Self-pay | Admitting: Internal Medicine

## 2019-01-13 VITALS — BP 136/80 | HR 78 | Resp 12 | Ht 68.0 in | Wt 190.0 lb

## 2019-01-13 DIAGNOSIS — F329 Major depressive disorder, single episode, unspecified: Secondary | ICD-10-CM

## 2019-01-13 DIAGNOSIS — K029 Dental caries, unspecified: Secondary | ICD-10-CM

## 2019-01-13 DIAGNOSIS — F32A Depression, unspecified: Secondary | ICD-10-CM

## 2019-01-13 DIAGNOSIS — R079 Chest pain, unspecified: Secondary | ICD-10-CM

## 2019-01-13 DIAGNOSIS — R42 Dizziness and giddiness: Secondary | ICD-10-CM | POA: Insufficient documentation

## 2019-01-13 DIAGNOSIS — Z653 Problems related to other legal circumstances: Secondary | ICD-10-CM

## 2019-01-13 MED ORDER — BUPROPION HCL ER (SR) 150 MG PO TB12: ORAL_TABLET | ORAL | 3 refills | Status: DC

## 2019-01-13 MED ORDER — FAMOTIDINE 20 MG PO TABS: ORAL_TABLET | ORAL | 6 refills | Status: DC

## 2019-01-13 NOTE — Progress Notes (Signed)
Social work Barrister's clerk completed new patient screening to assess for mental health symptoms and social determinants of health. Patient reported that he is experiencing some anhedonia, depression and fatigue. Patient acknowledged having suicidal thoughts in the past, but no specific plan. Jeffery Goodman acknowledged having stress around family issues. He reports that he worries about food insecurities often, also worries about being humiliated, losing his housing, lack of transportation and feeling  unsafe in his home. SWI gave him a  Recruitment consultant. Will start counseling with Jomarie Longs next week.

## 2019-01-13 NOTE — Patient Instructions (Signed)
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
## 2019-01-14 LAB — CBC WITH DIFFERENTIAL/PLATELET
Basophils Absolute: 0.1 10*3/uL (ref 0.0–0.2)
Basos: 1 %
EOS (ABSOLUTE): 0.2 10*3/uL (ref 0.0–0.4)
Eos: 2 %
Hematocrit: 43.8 % (ref 37.5–51.0)
Hemoglobin: 14.9 g/dL (ref 13.0–17.7)
Immature Grans (Abs): 0.1 10*3/uL (ref 0.0–0.1)
Immature Granulocytes: 1 %
Lymphocytes Absolute: 2.6 10*3/uL (ref 0.7–3.1)
Lymphs: 30 %
MCH: 32.3 pg (ref 26.6–33.0)
MCHC: 34 g/dL (ref 31.5–35.7)
MCV: 95 fL (ref 79–97)
Monocytes Absolute: 0.7 10*3/uL (ref 0.1–0.9)
Monocytes: 8 %
Neutrophils Absolute: 5 10*3/uL (ref 1.4–7.0)
Neutrophils: 58 %
Platelets: 351 10*3/uL (ref 150–450)
RBC: 4.61 x10E6/uL (ref 4.14–5.80)
RDW: 12.1 % (ref 11.6–15.4)
WBC: 8.5 10*3/uL (ref 3.4–10.8)

## 2019-01-14 LAB — LIPID PANEL W/O CHOL/HDL RATIO
CHOLESTEROL TOTAL: 228 mg/dL — AB (ref 100–199)
HDL: 48 mg/dL (ref >39)
LDL Calculated: 154 mg/dL — ABNORMAL HIGH (ref 0–99)
Triglycerides: 128 mg/dL (ref 0–149)
VLDL CHOLESTEROL CAL: 26 mg/dL (ref 5–40)

## 2019-01-14 LAB — COMPREHENSIVE METABOLIC PANEL
A/G RATIO: 2 (ref 1.2–2.2)
ALBUMIN: 4.9 g/dL (ref 3.8–4.9)
ALK PHOS: 59 IU/L (ref 39–117)
ALT: 17 IU/L (ref 0–44)
AST: 22 IU/L (ref 0–40)
BUN / CREAT RATIO: 18 (ref 9–20)
BUN: 18 mg/dL (ref 6–24)
Bilirubin Total: 0.3 mg/dL (ref 0.0–1.2)
CHLORIDE: 98 mmol/L (ref 96–106)
CO2: 22 mmol/L (ref 20–29)
Calcium: 10 mg/dL (ref 8.7–10.2)
Creatinine, Ser: 0.98 mg/dL (ref 0.76–1.27)
GFR calc non Af Amer: 86 mL/min/{1.73_m2} (ref >59)
GFR, EST AFRICAN AMERICAN: 100 mL/min/{1.73_m2} (ref >59)
Globulin, Total: 2.5 g/dL (ref 1.5–4.5)
Glucose: 92 mg/dL (ref 65–99)
Potassium: 5 mmol/L (ref 3.5–5.2)
SODIUM: 139 mmol/L (ref 134–144)
Total Protein: 7.4 g/dL (ref 6.0–8.5)

## 2019-01-16 ENCOUNTER — Telehealth: Payer: Self-pay

## 2019-01-16 NOTE — Telephone Encounter (Signed)
Patient called this morning stating. He started the Wellbutrin and started having issues with it after starting. States he was very anxious and jittery and talkative.states it also gave him insomnia. Patient states he stopped the medication 2 days after taking it.  Per Dr. Delrae Alfred have patient to stay off of medication until his follow up appointment this will and will change medications at that time. Spoke with patient and he is aware and verbalized understanding.

## 2019-01-20 ENCOUNTER — Ambulatory Visit (INDEPENDENT_AMBULATORY_CARE_PROVIDER_SITE_OTHER): Payer: Self-pay | Admitting: Internal Medicine

## 2019-01-20 ENCOUNTER — Ambulatory Visit: Payer: Self-pay | Admitting: Internal Medicine

## 2019-01-20 ENCOUNTER — Encounter: Payer: Self-pay | Admitting: Internal Medicine

## 2019-01-20 VITALS — BP 130/82 | HR 76 | Resp 12 | Ht 68.0 in | Wt 190.0 lb

## 2019-01-20 DIAGNOSIS — F32A Depression, unspecified: Secondary | ICD-10-CM

## 2019-01-20 DIAGNOSIS — G4700 Insomnia, unspecified: Secondary | ICD-10-CM

## 2019-01-20 DIAGNOSIS — F329 Major depressive disorder, single episode, unspecified: Secondary | ICD-10-CM

## 2019-01-20 DIAGNOSIS — R079 Chest pain, unspecified: Secondary | ICD-10-CM

## 2019-01-20 MED ORDER — FLUOXETINE HCL 10 MG PO TABS: 10.0000 mg | ORAL_TABLET | Freq: Every day | ORAL | 3 refills | Status: DC

## 2019-01-20 MED ORDER — TRAZODONE HCL 100 MG PO TABS: ORAL_TABLET | ORAL | 11 refills | Status: DC

## 2019-01-20 NOTE — Progress Notes (Signed)
    Subjective:    Patient ID: Jeffery Goodman, male   DOB: 06/15/1961, 58 y.o.   MRN: [id number]   HPI   1.  Depression:  Did not tolerate the bupropion.  Made him a nervous wreck.  No thoughts of suicide  2.  Struggling with sleep. Willing to try medication to help.  He has made changes with sleep hygiene.  Quit colas.    3.  GERD:   Taking Famotidine regularly and chest pain went away.    Current Meds  Medication Sig  . famotidine (PEPCID) 20 MG tablet 2 tabs by mouth at bedtime on empty stomach   No Known Allergies   Review of Systems    Objective:   BP 130/82 (BP Location: Left Arm, Patient Position: Sitting, Cuff Size: Normal)   Pulse 76   Resp 12   Ht 5\' 8"  (1.727 m)   Wt 190 lb (86.2 kg)   BMI 28.89 kg/m   Physical Exam   Jittery, irritable Chest:  CTA CV:  RRR  Abd:  S, NT, No HSM or mass, + BS   Assessment & Plan   1.  Depression/Anxiety:  Stop Bupropion.  Fluoxetine 10 mg in the morning.  Follow up in 1 week  2.  Insomnia:  Trazodone 100 mg 1/2 tab at bedtime.  3.  GERD:  Controlled with Famotidine
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
## 2019-01-27 ENCOUNTER — Other Ambulatory Visit: Payer: Self-pay | Admitting: Internal Medicine

## 2019-01-27 ENCOUNTER — Ambulatory Visit: Payer: Self-pay | Admitting: Internal Medicine

## 2019-01-27 ENCOUNTER — Encounter: Payer: Self-pay | Admitting: Internal Medicine

## 2019-01-27 VITALS — BP 136/82 | HR 70 | Resp 12 | Ht 68.0 in | Wt 197.0 lb

## 2019-01-27 DIAGNOSIS — F32A Depression, unspecified: Secondary | ICD-10-CM

## 2019-01-27 DIAGNOSIS — F329 Major depressive disorder, single episode, unspecified: Secondary | ICD-10-CM

## 2019-01-27 DIAGNOSIS — G4700 Insomnia, unspecified: Secondary | ICD-10-CM | POA: Insufficient documentation

## 2019-01-27 NOTE — Progress Notes (Signed)
    Subjective:    Patient ID: Jeffery Goodman, male   DOB: [birth year], 58 y.o.   MRN: [id number]   HPI   1.  Anxiety and depression:  Anxiety is still a concern, but not nearly as bad as when on Bupropion.   He did not realize he had an appt with Georgette, SW this morning. Discussed she is working on Landscape architect referral with me.    2.  Insomnia:  Now sleeping well.  He is taking Fluoxetine and Trazodone at bedtime.     Current Meds  Medication Sig  . famotidine (PEPCID) 20 MG tablet 2 tabs by mouth at bedtime on empty stomach  . FLUoxetine (PROZAC) 10 MG tablet Take 1 tablet (10 mg total) by mouth daily. 1 tab by mouth at bedtime  . traZODone (DESYREL) 100 MG tablet 1/2 to 1 tab by mouth at bedtime   No Known Allergies   Review of Systems    Objective:   BP 136/82 (BP Location: Left Arm, Patient Position: Sitting, Cuff Size: Normal)   Pulse 70   Resp 12   Ht 5\' 8"  (1.727 m)   Wt 197 lb (89.4 kg)   BMI 29.95 kg/m   Physical Exam   Appears calmer, more rested.  Better color   Assessment & Plan   1.  Depression/Anxiety:  Will move Fluoxetine to morning dosing.  Seems to be doing better  2.  Insomnia: much improved with Trazodone. If sleep worse by moving Fluoxetine to morning, okay to move back to bedtime with Trazodone.  Followup in 5 weeks.

## 2019-02-02 ENCOUNTER — Other Ambulatory Visit: Payer: Self-pay | Admitting: Internal Medicine

## 2019-02-03 ENCOUNTER — Ambulatory Visit: Payer: Self-pay | Admitting: Internal Medicine

## 2019-02-03 DIAGNOSIS — F32A Depression, unspecified: Secondary | ICD-10-CM

## 2019-02-03 DIAGNOSIS — F329 Major depressive disorder, single episode, unspecified: Secondary | ICD-10-CM

## 2019-02-03 NOTE — Progress Notes (Signed)
   THERAPY PROGRESS NOTE  Session Time: 1 hr.  Participation Level: Active  Behavioral Response: CasualAlertEuphoric and Euthymic  Type of Therapy: Individual Therapy  Treatment Goals addressed: Coping  Interventions: Strength-based  Summary:  Bryndon is a 58 year old Caucasian male who presents with a euthymic mood and appropriate affect. He reported that he has been sleeping better and have been doing great at avoiding confrontations with his dad. He reports still feeling a bit anxious when he see individuals approaching his home, but he does not let them in. Social Work Tax inspector, Barrister's clerk did some breathing exercises with him to keep him centered and relaxed in his  session. He stated that he has been eating better and is doing more exercise and less caffeine. Social work Tax inspector gave him some info on conflict resolution, mindfulness/grounding exercises and sleep hygiene to use as needed. SWI was able to complete his referral form with his authorization to send in to legal aid for assistance with his family issues. He stated that he is opened to continuing with his sessions, in hopes that he can maintain healthy coping mechanisms through this process. SWI scheduled his next session in 2 weeks.  Therapist Response.   Social work Barrister's clerk utilized supportive counseling techniques throughout the session. SWI validated Urijah's feelings and encouraged open expression of emotions. Folashade Gamboa did some mindfulness activity to help him relax and stay calm. SWI used Motivational Interviewing techniques including empathic reflection, supporting self-efficacy and reflective listening to continue to build a therapeutic relationship with Jomarie Longs. SWI also used Brief Solution-Focused therapy by helping him focus on his present and future goals. SWI scheduled a follow-up appointment for Friday, February 15, 2019.   Suicidal/Homicidal: Nowithout intent/plan  Plan: Return  again in 2 weeks     Daphene Calamity, Student-Social Work 02/03/2019

## 2019-02-14 ENCOUNTER — Ambulatory Visit: Payer: Self-pay | Admitting: Internal Medicine

## 2019-02-15 ENCOUNTER — Other Ambulatory Visit: Payer: Self-pay | Admitting: Internal Medicine

## 2019-02-20 ENCOUNTER — Telehealth: Payer: Self-pay | Admitting: Internal Medicine

## 2019-02-20 NOTE — Telephone Encounter (Signed)
Patient needs refill of famotidine (PEPCID) 20 mg tablet.  FLUoxetine(PROZAC) 10 mg does not seem to help.  traZODone (DESYREL) 100 mg is taking half a tablet since presecribed, not working now taking a whole tablet and still not sleeping.  Just restless and nervous.  Please advise.

## 2019-02-21 NOTE — Telephone Encounter (Signed)
Spoke with patient. Patient would like to be seen earlier then the 20 th. Patient scheduled for 02/27/2019.

## 2019-02-27 ENCOUNTER — Ambulatory Visit: Payer: Self-pay | Admitting: Internal Medicine

## 2019-02-27 ENCOUNTER — Encounter: Payer: Self-pay | Admitting: Internal Medicine

## 2019-02-27 VITALS — BP 128/88 | HR 78 | Resp 12 | Ht 68.0 in | Wt 194.0 lb

## 2019-02-27 DIAGNOSIS — F419 Anxiety disorder, unspecified: Secondary | ICD-10-CM

## 2019-02-27 DIAGNOSIS — F32A Depression, unspecified: Secondary | ICD-10-CM

## 2019-02-27 DIAGNOSIS — G4700 Insomnia, unspecified: Secondary | ICD-10-CM

## 2019-02-27 DIAGNOSIS — K219 Gastro-esophageal reflux disease without esophagitis: Secondary | ICD-10-CM

## 2019-02-27 DIAGNOSIS — F329 Major depressive disorder, single episode, unspecified: Secondary | ICD-10-CM

## 2019-02-27 MED ORDER — TRAZODONE HCL 100 MG PO TABS: ORAL_TABLET | ORAL | 11 refills | Status: DC

## 2019-02-27 MED ORDER — FLUOXETINE HCL 10 MG PO TABS: ORAL_TABLET | ORAL | 10 refills | Status: DC

## 2019-02-27 MED ORDER — FAMOTIDINE 20 MG PO TABS: ORAL_TABLET | ORAL | 6 refills | Status: DC

## 2019-02-27 NOTE — Progress Notes (Signed)
    Subjective:    Patient ID: Jeffery Goodman, male   DOB: 1961/09/26, 58 y.o.   MRN: [id number]   HPI   1.  Anxiety/Depression/Anxiety:  Patient stopped taking Fluoxetine from what I can tell about 1 week ago.  States he did not note much improvement.   Reiterated sometimes takes up to 6 weeks of being on medication before see optimal changes. He states he did not refill after filled medication once (all meds) Discussed he needs to stay on meds and refill monthly. He did not find the 50 mg dose of Trazodone was helping with sleep and increased to 100 mg before he ran out. States in the 1990s was given an orange pill twice daily by his FP that helped with his anxiety issues.  Cannot recall the name.  2.  GERD/chest complaints:  Back again as ran out of Famotidine as well.  Discussed has 6 refills at [facility].  He is taking this about 1/2 hour after Trazodone before bedtime  Current Meds  Medication Sig  . traZODone (DESYREL) 100 MG tablet 1/2 to 1 tab by mouth at bedtime   No Known Allergies   Review of Systems    Objective:   BP 128/88 (BP Location: Left Arm, Patient Position: Sitting, Cuff Size: Normal)   Pulse 78   Resp 12   Ht 5\' 8"  (1.727 m)   Wt 194 lb (88 kg)   BMI 29.50 kg/m   Physical Exam NAD  Assessment & Plan  1.  Depression/Anxiety:  Willing to try the Fluoxetine again with Trazodone at bedtime at 100 mg.   Return in 3 weeks and if still with significant symptoms, will increase Fluoxetine to 20 mg.     2.  GERD:  Famotidine 40 mg at bedtime.

## 2019-03-10 ENCOUNTER — Ambulatory Visit: Payer: Self-pay | Admitting: Internal Medicine

## 2019-03-10 ENCOUNTER — Other Ambulatory Visit: Payer: Self-pay | Admitting: Internal Medicine

## 2019-03-20 ENCOUNTER — Ambulatory Visit: Payer: Self-pay | Admitting: Internal Medicine

## 2019-03-30 ENCOUNTER — Telehealth: Payer: Self-pay | Admitting: Internal Medicine

## 2019-03-30 MED ORDER — FAMOTIDINE 20 MG PO TABS: ORAL_TABLET | ORAL | 6 refills | Status: DC

## 2019-03-30 MED ORDER — SERTRALINE HCL 25 MG PO TABS: ORAL_TABLET | ORAL | 2 refills | Status: DC
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
## 2019-04-07 ENCOUNTER — Other Ambulatory Visit: Payer: Self-pay

## 2019-04-07 ENCOUNTER — Telehealth (INDEPENDENT_AMBULATORY_CARE_PROVIDER_SITE_OTHER): Payer: Self-pay | Admitting: Internal Medicine

## 2019-04-07 DIAGNOSIS — G4700 Insomnia, unspecified: Secondary | ICD-10-CM

## 2019-04-07 DIAGNOSIS — F329 Major depressive disorder, single episode, unspecified: Secondary | ICD-10-CM

## 2019-04-07 DIAGNOSIS — F419 Anxiety disorder, unspecified: Secondary | ICD-10-CM

## 2019-04-07 DIAGNOSIS — F32A Depression, unspecified: Secondary | ICD-10-CM

## 2019-04-07 NOTE — Progress Notes (Signed)
    Subjective:    Patient ID: Jeffery Goodman, male   DOB: 02/10/1961, 58 y.o.   MRN: [id number]   HPI   Virtual Video visit via [employer]  1.  Depression and anxiety:  Switched to Sertraline 25 mg daily from Fluoxetine about 4 days ago.  Actually stopped the Fluoxetine first 1-2 weeks ago as was causing increased agitation.   Tolerating the Sertraline better thus far.   Ex Girlfriend is no longer   2.  Insomnia:  Sleeping well now with Trazodone.    No other concerns  Current Meds  Medication Sig  . famotidine (PEPCID) 20 MG tablet 2 tabs by mouth at bedtime on empty stomach 1/2 hour after Trazodone  . sertraline (ZOLOFT) 25 MG tablet 1 tab by mouth daily for 7 days then 2 tabs by mouth once daily.  . traZODone (DESYREL) 100 MG tablet 1 tab by mouth at bedtime   No Known Allergies   Review of Systems    Objective:   There were no vitals taken for this visit.  Physical Exam Appears rested, calm.  Smiles and laughs easily. Assessment & Plan  1.  Depression /anxiety:  Perhaps a bit better with low dose Sertraline.  Will see whether this works well for him.   If feels his symptoms are well controlled with 25 mg , okay to stay at that dose, but if tolerating well and still with symptoms, to increase to 50 mg  2  Insomnia:  Nicely controlled with Trazodone.  Followup in 6-8 weeks.

## 2019-04-14 NOTE — Telephone Encounter (Signed)
noted 

## 2019-04-25 ENCOUNTER — Ambulatory Visit: Payer: Self-pay | Admitting: Internal Medicine

## 2019-05-23 ENCOUNTER — Ambulatory Visit: Payer: Self-pay | Admitting: Internal Medicine

## 2019-05-31 ENCOUNTER — Encounter: Payer: Self-pay | Admitting: Internal Medicine

## 2019-05-31 ENCOUNTER — Ambulatory Visit: Payer: Self-pay | Admitting: Internal Medicine

## 2019-05-31 VITALS — BP 110/62 | HR 72 | Resp 12 | Ht 68.0 in | Wt 183.0 lb

## 2019-05-31 DIAGNOSIS — F419 Anxiety disorder, unspecified: Secondary | ICD-10-CM

## 2019-05-31 DIAGNOSIS — G4700 Insomnia, unspecified: Secondary | ICD-10-CM

## 2019-05-31 DIAGNOSIS — Z202 Contact with and (suspected) exposure to infections with a predominantly sexual mode of transmission: Secondary | ICD-10-CM

## 2019-05-31 MED ORDER — METOPROLOL TARTRATE 25 MG PO TABS: ORAL_TABLET | ORAL | 6 refills | Status: DC
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
## 2019-06-01 LAB — SPECIMEN STATUS REPORT

## 2019-06-01 LAB — RPR QUALITATIVE: RPR Ser Ql: NONREACTIVE

## 2019-06-01 LAB — HIV ANTIBODY (ROUTINE TESTING W REFLEX): HIV Screen 4th Generation wRfx: NONREACTIVE

## 2019-06-08 LAB — GC/CHLAMYDIA PROBE AMP
Chlamydia trachomatis, NAA: NEGATIVE
Neisseria Gonorrhoeae by PCR: NEGATIVE

## 2019-06-08 LAB — SPECIMEN STATUS REPORT

## 2019-06-14 ENCOUNTER — Ambulatory Visit: Payer: Self-pay

## 2019-06-14 ENCOUNTER — Other Ambulatory Visit: Payer: Self-pay

## 2019-06-14 VITALS — BP 122/70 | HR 78

## 2019-06-14 DIAGNOSIS — I10 Essential (primary) hypertension: Secondary | ICD-10-CM

## 2019-06-14 DIAGNOSIS — R251 Tremor, unspecified: Secondary | ICD-10-CM

## 2019-06-14 NOTE — Progress Notes (Addendum)
Patient BP in normal range. Informed to continue current dose of medication.  Correction to diagnosis and reason for beta blockade:  Hand tremors

## 2019-09-01 ENCOUNTER — Ambulatory Visit: Payer: Self-pay | Admitting: Internal Medicine

## 2019-09-01 ENCOUNTER — Other Ambulatory Visit: Payer: Self-pay

## 2019-09-01 ENCOUNTER — Encounter: Payer: Self-pay | Admitting: Internal Medicine

## 2019-09-01 VITALS — BP 102/70 | HR 74 | Resp 12 | Ht 68.0 in | Wt 171.0 lb

## 2019-09-01 DIAGNOSIS — G4700 Insomnia, unspecified: Secondary | ICD-10-CM

## 2019-09-01 DIAGNOSIS — I10 Essential (primary) hypertension: Secondary | ICD-10-CM

## 2019-09-01 MED ORDER — TRAZODONE HCL 100 MG PO TABS: ORAL_TABLET | ORAL | 11 refills | Status: DC

## 2019-09-01 NOTE — Progress Notes (Signed)
    Subjective:    Patient ID: Jeffery Goodman, male   DOB: 03-22-1961, 58 y.o.   MRN: [id number]   HPI   1.  Tremor:  Not clear if improved before he started with agitation.  2.  Insomnia:  States he was doing well and then the 100 mg of Trazodone stopped working.   He is just really aggravated Found out his stepmother is very ill with stage 4 cancer in [place] and he would like to visit with her. Apparently, his relationship with his father is not a huge concern to him any longer.    3.  Hypertension:  No concerns with Metoprolol.  Current Meds  Medication Sig  . famotidine (PEPCID) 20 MG tablet 2 tabs by mouth at bedtime on empty stomach 1/2 hour after Trazodone  . metoprolol tartrate (LOPRESSOR) 25 MG tablet 1/2 tab by mouth twice daily  . traZODone (DESYREL) 100 MG tablet 1 tab by mouth at bedtime   No Known Allergies   Review of Systems    Objective:   BP 102/70 (BP Location: Left Arm, Patient Position: Sitting, Cuff Size: Normal)   Pulse 74   Resp 12   Ht 5\' 8"  (1.727 m)   Wt 171 lb (77.6 kg)   BMI 26.00 kg/m   Physical Exam  NAD HEENT:  PERRL, EOMI Neck:   Supple, No adenoapathy Chest:  CTA CV:   RRR without murmur or rub.  Radial pulses normal and equal.    Assessment & Plan  1.  Insomnia:  Increase Trazodone to 150 mg at bedtime.  Discussed again good sleep hygiene.  2.  Hypertension:  Controlled.  3.  HM:  Recommended influenza vaccine on the 17th.  He will think about CPE after next visit  4.  [id number] testing information given--to be tested before visiting his stepmother with cancer.

## 2019-09-01 NOTE — Patient Instructions (Addendum)
Go online to [facility]. [place] fro 10 a.m. to 2 p.m. for COVID19 Go to their website to register.  Also--[place] has one at [facility] on Sept 17th--next Thursday.  Flu shot clinic October 15th 8:30 to 11:30.  Bedtime at 11:30 p.m.  May take 1 1/2 Trazodone at bedtime. If you are unable to sleep/fall back to sleep in 20 minutes--READ--no video or busy work

## 2019-12-04 ENCOUNTER — Encounter: Payer: Self-pay | Admitting: Internal Medicine

## 2019-12-04 ENCOUNTER — Other Ambulatory Visit: Payer: Self-pay

## 2019-12-04 ENCOUNTER — Ambulatory Visit (INDEPENDENT_AMBULATORY_CARE_PROVIDER_SITE_OTHER): Payer: Self-pay | Admitting: Internal Medicine

## 2019-12-04 VITALS — BP 110/70 | HR 60 | Temp 98.6°F | Resp 12 | Ht 68.0 in | Wt 174.5 lb

## 2019-12-04 DIAGNOSIS — G5603 Carpal tunnel syndrome, bilateral upper limbs: Secondary | ICD-10-CM

## 2019-12-04 DIAGNOSIS — L301 Dyshidrosis [pompholyx]: Secondary | ICD-10-CM

## 2019-12-04 DIAGNOSIS — M546 Pain in thoracic spine: Secondary | ICD-10-CM

## 2019-12-04 DIAGNOSIS — M25561 Pain in right knee: Secondary | ICD-10-CM

## 2019-12-04 DIAGNOSIS — G5623 Lesion of ulnar nerve, bilateral upper limbs: Secondary | ICD-10-CM

## 2019-12-04 MED ORDER — TRIAMCINOLONE ACETONIDE 0.1 % EX CREA: 1.0000 "application " | TOPICAL_CREAM | Freq: Two times a day (BID) | CUTANEOUS | 2 refills | Status: DC

## 2019-12-04 MED ORDER — CYCLOBENZAPRINE HCL 10 MG PO TABS: 10.0000 mg | ORAL_TABLET | Freq: Three times a day (TID) | ORAL | 2 refills | Status: DC | PRN

## 2019-12-04 NOTE — Patient Instructions (Signed)
Wear cock up splints daily through night and during day when not working. Cushions to prevent compression of elbows when sleep  Ibuprofen 400-800 mg twice daily with food for 14 days when first start the splints
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
## 2019-12-27 ENCOUNTER — Telehealth: Payer: Self-pay | Admitting: Internal Medicine

## 2019-12-27 NOTE — Telephone Encounter (Signed)
Patient called requesting Rx on cyclobenzaprine (FLEXERIL) 10 MG tablet and famotidine (PEPCID) 20 MG tablet to be called in at the default pharmacies.  Please advise.

## 2019-12-28 ENCOUNTER — Other Ambulatory Visit: Payer: Self-pay

## 2019-12-28 MED ORDER — FAMOTIDINE 20 MG PO TABS: ORAL_TABLET | ORAL | 6 refills | Status: DC

## 2019-12-28 NOTE — Telephone Encounter (Signed)
Spoke with patient. Informed he has refills on medication. Informed to go to pharmacy. Patient verbalized understanding.

## 2020-01-15 DIAGNOSIS — M25561 Pain in right knee: Secondary | ICD-10-CM | POA: Insufficient documentation

## 2020-01-15 DIAGNOSIS — L301 Dyshidrosis [pompholyx]: Secondary | ICD-10-CM | POA: Insufficient documentation

## 2020-01-15 DIAGNOSIS — M546 Pain in thoracic spine: Secondary | ICD-10-CM | POA: Insufficient documentation

## 2020-01-15 DIAGNOSIS — G5623 Lesion of ulnar nerve, bilateral upper limbs: Secondary | ICD-10-CM | POA: Insufficient documentation

## 2020-01-15 DIAGNOSIS — G5603 Carpal tunnel syndrome, bilateral upper limbs: Secondary | ICD-10-CM | POA: Insufficient documentation

## 2020-01-28 ENCOUNTER — Encounter: Payer: Self-pay | Admitting: Internal Medicine

## 2020-03-05 ENCOUNTER — Encounter: Payer: Self-pay | Admitting: Internal Medicine

## 2020-03-05 ENCOUNTER — Ambulatory Visit (INDEPENDENT_AMBULATORY_CARE_PROVIDER_SITE_OTHER): Payer: Self-pay | Admitting: Internal Medicine

## 2020-03-05 ENCOUNTER — Other Ambulatory Visit: Payer: Self-pay

## 2020-03-05 VITALS — BP 126/82 | HR 70 | Resp 12 | Ht 68.0 in | Wt 179.0 lb

## 2020-03-05 DIAGNOSIS — G4700 Insomnia, unspecified: Secondary | ICD-10-CM

## 2020-03-05 DIAGNOSIS — L659 Nonscarring hair loss, unspecified: Secondary | ICD-10-CM

## 2020-03-05 DIAGNOSIS — M545 Low back pain, unspecified: Secondary | ICD-10-CM

## 2020-03-05 DIAGNOSIS — G5623 Lesion of ulnar nerve, bilateral upper limbs: Secondary | ICD-10-CM

## 2020-03-05 DIAGNOSIS — G5603 Carpal tunnel syndrome, bilateral upper limbs: Secondary | ICD-10-CM

## 2020-03-05 MED ORDER — PREDNISONE 10 MG PO TABS: ORAL_TABLET | ORAL | 0 refills | Status: DC

## 2020-03-05 MED ORDER — CYCLOBENZAPRINE HCL 10 MG PO TABS: 10.0000 mg | ORAL_TABLET | Freq: Three times a day (TID) | ORAL | 6 refills | Status: DC | PRN

## 2020-03-05 NOTE — Progress Notes (Signed)
    Subjective:    Patient ID: Jeffery Goodman, male   DOB: 03/27/1961, 59 y.o.   MRN: [id number]   HPI   1.  Muscle spasm down left neck and in left forearm.  Has been using Cyclobenzaprine as needed.  Has helped significantly.  Fingers are not getting caught in flexion as he was previously. He is good with fluid intake during day--both milk and water. Did not get cock up splints for wrists.  He did make his own metal splints.  He felt more pain, so stopped using.  Has not obtained cushion for elbows.  He did use pillows to cushion them and that has helped.  Sounds like sleep is better as well.  2.  Bilateral low back pain without radiculopathy.  Has had for past 3 days.  He has tried cyclobenzaprine maybe twice daily and does not note a particular improvement.   He did not go to PT as he did not feel safe with COVID strains popping up.  3.  Having hair loss.  Not clear if loss from broken hair and dryness vs loss from root.  More likely the latter based on his description. No skin changes.  Current Meds  Medication Sig  . cyclobenzaprine (FLEXERIL) 10 MG tablet Take 1 tablet (10 mg total) by mouth 3 (three) times daily as needed for muscle spasms.  . famotidine (PEPCID) 20 MG tablet 2 tabs by mouth at bedtime on empty stomach 1/2 hour after Trazodone  . metoprolol tartrate (LOPRESSOR) 25 MG tablet 1/2 tab by mouth twice daily  . traZODone (DESYREL) 100 MG tablet 1 1/2 tabs by mouth at bedtime  . triamcinolone cream (KENALOG) 0.1 % Apply 1 application topically 2 (two) times daily.   No Known Allergies   Review of Systems    Objective:   BP 126/82 (BP Location: Left Arm, Patient Position: Sitting, Cuff Size: Normal)   Pulse 70   Resp 12   Ht 5\' 8"  (1.727 m)   Wt 179 lb (81.2 kg)   BMI 27.22 kg/m   Physical Exam  NAD HEENT:  Hair appears a bit dry.   Neck:  Supple, moves without difficulty and flexes/extends fingers and hands without difficulty Back:  Tender over  paraspinous musculature in lumbar area.  NT over spinous processes. Neuro:  LE:  Motor 5/5 and DTRs 2+/4 throughout.  Sensory grossly normal.      Assessment & Plan   1.  Neck/arm/CTS/ulnar neuropathy:  All seem improved   2.  Insomnia:  Improved--mainly due to improvement in musculoskeletal pain issues  3.  Low Back pain:  Muscular:  Prednisone 30 mg daily for 4 days, then taper by 5 mg daily until off.   May use Cyclobenzaprine more frequently with this pain as well--3 times daily.  4.  Hair loss:  TSH.

## 2020-03-05 NOTE — Patient Instructions (Signed)
Prednisone 10 mg directions:    Days 1, 2, 3, 4:  3 tabs by mouth daily Day 5:  3.5 tabs  Day 6:  3 tabs Day 7:  2.5 tabs Day 8:  2 tabs Day 9:  1.5 tabs Day 10:  1 tab Day 11:  0.5 tab Day 12:  done

## 2020-03-06 LAB — TSH: TSH: 1.34 u[IU]/mL (ref 0.450–4.500)

## 2020-05-16 ENCOUNTER — Other Ambulatory Visit: Payer: Self-pay | Admitting: Internal Medicine

## 2020-06-13 ENCOUNTER — Other Ambulatory Visit: Payer: Self-pay

## 2020-06-13 MED ORDER — METOPROLOL TARTRATE 25 MG PO TABS: ORAL_TABLET | ORAL | 6 refills | Status: DC

## 2020-07-05 ENCOUNTER — Encounter: Payer: Self-pay | Admitting: Internal Medicine

## 2020-08-13 ENCOUNTER — Encounter: Payer: Self-pay | Admitting: Internal Medicine

## 2020-08-13 ENCOUNTER — Other Ambulatory Visit: Payer: Self-pay

## 2020-08-13 ENCOUNTER — Telehealth: Payer: Self-pay | Admitting: Internal Medicine

## 2020-08-13 MED ORDER — CYCLOBENZAPRINE HCL 10 MG PO TABS: 10.0000 mg | ORAL_TABLET | Freq: Three times a day (TID) | ORAL | 6 refills | Status: DC | PRN

## 2020-08-13 NOTE — Telephone Encounter (Signed)
Patient called requesting Rx on cyclobenzaprine (FLEXERIL) 10 MG tablet to be called in at [facility] on [address]

## 2020-08-13 NOTE — Telephone Encounter (Signed)
Rx sent to [facility] 
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
## 2020-09-03 ENCOUNTER — Telehealth: Payer: Self-pay | Admitting: Internal Medicine

## 2020-09-03 NOTE — Telephone Encounter (Signed)
Patient called requesting Rx on traZODone (DESYREL) 100 MG tablet to be called in at [facility].

## 2020-09-03 NOTE — Telephone Encounter (Signed)
To Dr. Mulberry for approval 

## 2020-09-05 MED ORDER — TRAZODONE HCL 100 MG PO TABS: ORAL_TABLET | ORAL | 11 refills | Status: DC

## 2020-09-11 NOTE — Telephone Encounter (Signed)
Pt informed

## 2020-09-30 ENCOUNTER — Telehealth: Payer: Self-pay | Admitting: Internal Medicine

## 2020-09-30 NOTE — Telephone Encounter (Signed)
Pt. Called requesting Rx on triamcinolone cream (KENALOG) 0.1 % to be called in at [facility] on [address]  Please advise.

## 2020-09-30 NOTE — Telephone Encounter (Signed)
Message sent to Dr. Mulberry  

## 2020-10-03 ENCOUNTER — Ambulatory Visit (INDEPENDENT_AMBULATORY_CARE_PROVIDER_SITE_OTHER): Payer: Medicaid Other | Admitting: Internal Medicine

## 2020-10-03 ENCOUNTER — Encounter: Payer: Self-pay | Admitting: Internal Medicine

## 2020-10-03 VITALS — BP 123/82 | HR 66 | Resp 12 | Ht 68.0 in | Wt 173.0 lb

## 2020-10-03 DIAGNOSIS — L301 Dyshidrosis [pompholyx]: Secondary | ICD-10-CM | POA: Diagnosis not present

## 2020-10-03 DIAGNOSIS — H43393 Other vitreous opacities, bilateral: Secondary | ICD-10-CM

## 2020-10-03 DIAGNOSIS — Z1211 Encounter for screening for malignant neoplasm of colon: Secondary | ICD-10-CM

## 2020-10-03 DIAGNOSIS — E782 Mixed hyperlipidemia: Secondary | ICD-10-CM

## 2020-10-03 DIAGNOSIS — R739 Hyperglycemia, unspecified: Secondary | ICD-10-CM

## 2020-10-03 DIAGNOSIS — G5623 Lesion of ulnar nerve, bilateral upper limbs: Secondary | ICD-10-CM

## 2020-10-03 DIAGNOSIS — F419 Anxiety disorder, unspecified: Secondary | ICD-10-CM | POA: Diagnosis not present

## 2020-10-03 DIAGNOSIS — H547 Unspecified visual loss: Secondary | ICD-10-CM | POA: Diagnosis not present

## 2020-10-03 DIAGNOSIS — Z639 Problem related to primary support group, unspecified: Secondary | ICD-10-CM | POA: Diagnosis not present

## 2020-10-03 DIAGNOSIS — G5603 Carpal tunnel syndrome, bilateral upper limbs: Secondary | ICD-10-CM

## 2020-10-03 DIAGNOSIS — Z0000 Encounter for general adult medical examination without abnormal findings: Secondary | ICD-10-CM | POA: Diagnosis not present

## 2020-10-03 DIAGNOSIS — G250 Essential tremor: Secondary | ICD-10-CM | POA: Insufficient documentation

## 2020-10-03 MED ORDER — TRIAMCINOLONE ACETONIDE 0.1 % EX CREA: TOPICAL_CREAM | CUTANEOUS | 2 refills | Status: DC

## 2020-10-03 MED ORDER — PRIMIDONE 50 MG PO TABS: ORAL_TABLET | ORAL | 3 refills | Status: DC

## 2020-10-03 NOTE — Patient Instructions (Signed)
Can google "advance directives, [place]"  And bring up form from Secretary of State. Print and fill out Or can go to "5 wishes"  Which is also in Spanish and fill out--this costs $5--perhaps easier to use. Designate a Medical Power of Attorney to speak for you if you are unable to speak for yourself when ill or injured  
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
## 2020-10-09 ENCOUNTER — Ambulatory Visit (INDEPENDENT_AMBULATORY_CARE_PROVIDER_SITE_OTHER): Payer: Medicaid Other | Admitting: Clinical

## 2020-10-09 DIAGNOSIS — F419 Anxiety disorder, unspecified: Secondary | ICD-10-CM

## 2020-10-10 NOTE — Telephone Encounter (Signed)
Pt. Informed. Agreed
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
## 2020-10-16 ENCOUNTER — Ambulatory Visit: Payer: Medicaid Other | Admitting: Clinical

## 2020-10-16 DIAGNOSIS — F411 Generalized anxiety disorder: Secondary | ICD-10-CM

## 2020-10-18 ENCOUNTER — Encounter: Payer: Self-pay | Admitting: Internal Medicine

## 2020-10-18 ENCOUNTER — Ambulatory Visit (INDEPENDENT_AMBULATORY_CARE_PROVIDER_SITE_OTHER): Payer: Medicaid Other | Admitting: Internal Medicine

## 2020-10-18 VITALS — BP 125/79 | HR 75 | Resp 14 | Ht 68.5 in | Wt 173.0 lb

## 2020-10-18 DIAGNOSIS — L301 Dyshidrosis [pompholyx]: Secondary | ICD-10-CM | POA: Diagnosis not present

## 2020-10-18 DIAGNOSIS — G250 Essential tremor: Secondary | ICD-10-CM

## 2020-10-18 DIAGNOSIS — E782 Mixed hyperlipidemia: Secondary | ICD-10-CM

## 2020-10-18 DIAGNOSIS — Z125 Encounter for screening for malignant neoplasm of prostate: Secondary | ICD-10-CM

## 2020-10-18 DIAGNOSIS — R739 Hyperglycemia, unspecified: Secondary | ICD-10-CM | POA: Diagnosis not present

## 2020-10-18 DIAGNOSIS — Z79899 Other long term (current) drug therapy: Secondary | ICD-10-CM | POA: Diagnosis not present

## 2020-10-18 DIAGNOSIS — Z23 Encounter for immunization: Secondary | ICD-10-CM | POA: Diagnosis not present

## 2020-10-18 NOTE — Progress Notes (Signed)
    Subjective:    Patient ID: Jeffery Goodman, male   DOB: 1961/09/03, 59 y.o.   MRN: [id number]   HPI   1.  Tremor:  Taking Primodone for 2 weeks.  Currently taking twice daily.  Feels he has less tremor now with fine painting of autos.    2.  Insomnia:  Not sleeping well since held Trazodone while he started Primidone.    3.  Here for fasting labs and flu vaccine  Current Meds  Medication Sig   cyclobenzaprine (FLEXERIL) 10 MG tablet Take 1 tablet (10 mg total) by mouth 3 (three) times daily as needed for muscle spasms.   primidone (MYSOLINE) 50 MG tablet 1 tab by mouth at bedtime for 3 days then 1 tab twice daily with breakfast and at bedtime   triamcinolone cream (KENALOG) 0.1 % APPLY 1 APPLICATION TOPICALLY TWO TIMES A DAY      No Known Allergies   Review of Systems    Objective:   BP 125/79 (BP Location: Left Arm, Patient Position: Sitting, Cuff Size: Normal)   Pulse 75   Resp 14   Ht 5' 8.5" (1.74 m)   Wt 173 lb (78.5 kg)   BMI 25.92 kg/m   Physical Exam NAD Tremor seems decreased with intention.   Assessment & Plan   Intention Tremor:  continue Primidone.    2.  Chronic insomnia:  restart Trazodone at 50 mg nightly and increase in 2-3 nights to 100 mg if no improvement.    3.  Hyperglycemia:  A1C  4.  Hyperlipidemia:  FLP  5.  HM:  CBC, CMP, PSA

## 2020-10-18 NOTE — Patient Instructions (Signed)
Start Trazodone back at 50 mg at bedtime or 1/2 tab.  Can increase by 1/2 tab nightly after 2-3 nights if not sleeping.

## 2020-10-19 LAB — COMPREHENSIVE METABOLIC PANEL
ALT: 17 IU/L (ref 0–44)
AST: 21 IU/L (ref 0–40)
Albumin/Globulin Ratio: 2 (ref 1.2–2.2)
Albumin: 4.5 g/dL (ref 3.8–4.9)
Alkaline Phosphatase: 65 IU/L (ref 44–121)
BUN/Creatinine Ratio: 20 (ref 9–20)
BUN: 18 mg/dL (ref 6–24)
Bilirubin Total: 0.3 mg/dL (ref 0.0–1.2)
CO2: 22 mmol/L (ref 20–29)
Calcium: 9.5 mg/dL (ref 8.7–10.2)
Chloride: 101 mmol/L (ref 96–106)
Creatinine, Ser: 0.91 mg/dL (ref 0.76–1.27)
GFR calc Af Amer: 108 mL/min/{1.73_m2} (ref >59)
GFR calc non Af Amer: 93 mL/min/{1.73_m2} (ref >59)
Globulin, Total: 2.2 g/dL (ref 1.5–4.5)
Glucose: 96 mg/dL (ref 65–99)
Potassium: 4.9 mmol/L (ref 3.5–5.2)
Sodium: 137 mmol/L (ref 134–144)
Total Protein: 6.7 g/dL (ref 6.0–8.5)

## 2020-10-19 LAB — CBC WITH DIFFERENTIAL/PLATELET
Basophils Absolute: 0.1 10*3/uL (ref 0.0–0.2)
Basos: 1 %
EOS (ABSOLUTE): 0.1 10*3/uL (ref 0.0–0.4)
Eos: 2 %
Hematocrit: 39.9 % (ref 37.5–51.0)
Hemoglobin: 13.8 g/dL (ref 13.0–17.7)
Immature Grans (Abs): 0 10*3/uL (ref 0.0–0.1)
Immature Granulocytes: 0 %
Lymphocytes Absolute: 2.3 10*3/uL (ref 0.7–3.1)
Lymphs: 33 %
MCH: 34.1 pg — ABNORMAL HIGH (ref 26.6–33.0)
MCHC: 34.6 g/dL (ref 31.5–35.7)
MCV: 99 fL — ABNORMAL HIGH (ref 79–97)
Monocytes Absolute: 0.5 10*3/uL (ref 0.1–0.9)
Monocytes: 7 %
Neutrophils Absolute: 3.9 10*3/uL (ref 1.4–7.0)
Neutrophils: 57 %
Platelets: 339 10*3/uL (ref 150–450)
RBC: 4.05 x10E6/uL — ABNORMAL LOW (ref 4.14–5.80)
RDW: 11.8 % (ref 11.6–15.4)
WBC: 6.8 10*3/uL (ref 3.4–10.8)

## 2020-10-19 LAB — LIPID PANEL W/O CHOL/HDL RATIO
Cholesterol, Total: 182 mg/dL (ref 100–199)
HDL: 54 mg/dL (ref >39)
LDL Chol Calc (NIH): 114 mg/dL — ABNORMAL HIGH (ref 0–99)
Triglycerides: 77 mg/dL (ref 0–149)
VLDL Cholesterol Cal: 14 mg/dL (ref 5–40)

## 2020-10-19 LAB — PSA: Prostate Specific Ag, Serum: 0.5 ng/mL (ref 0.0–4.0)

## 2020-10-19 LAB — HGB A1C W/O EAG: Hgb A1c MFr Bld: 5.4 % (ref 4.8–5.6)

## 2020-10-23 ENCOUNTER — Ambulatory Visit: Payer: Medicaid Other | Admitting: Clinical

## 2020-10-23 ENCOUNTER — Other Ambulatory Visit: Payer: Self-pay

## 2020-10-23 DIAGNOSIS — F411 Generalized anxiety disorder: Secondary | ICD-10-CM

## 2020-10-23 NOTE — Progress Notes (Signed)
THERAPY PROGRESS NOTE  Session Time: 10 am - 11 am. Participation Level: Active Behavioral Response: CasualAlertAnxious Type of Therapy: Individual Therapy Treatment Goals addressed: Anxiety   Purpose: MSW Intern met with the patient for routine individual therapy to work towards treatment goals: managing anxiety symptoms, increasing social support, and processing difficult family relationships.  Intervention: MSW Intern Susannah met with the patient for their third therapy session together to work on discussing his anxiety symptoms and difficult family relationships. Intern began with a check-in to see how the patient had been feeling and assessed for any significant events over the last week.The patient said he was feeling okay and that nothing major had happened during the week other than spending time with his son. Intern provided reflective listening skills as the patient discussed how his son's visit made him feel. Intern utilized the intervention of CBT and strengths-based approach to decrease anxiety symptoms and work towards rebuilding his relationship with his son. MSW Intern assessed for SI/HI/command psychosis.  Effectiveness: Patient is alert x4 affect. Patient identified he is doing well overall but had a difficult encounter with his son this week. Patient participated in session sharing how his son came over randomly one day and spent time with him. The patient expressed how he believed that his son was only there to get something from him and take advantage of his time and efforts. When the Intern tried to pinpoint what the son did to cause these feelings, the patient could only describe it as "a vibe." The Intern challenged the patient to pay attention to those feelings if he encountered them again in the following week and try to locate why he might be feeling that way towards his son. The Intern distinguished between a friend treating the patient poorly and having the evidence to  support his feelings versus having his past traumatic experiences with his own father clouding his judgement toward others. The patient agreed to do so.  Intervention was effective as patient was able to reflect. Progress towards goal is Ongoing. Patient denied active suicidal/homicidal/active psychosis.  Plan Patient offered next appointment for: 10/30/2020  Diagnosis: Generalized Anxiety Disorder    [name] 10/23/2020   

## 2020-10-23 NOTE — Progress Notes (Addendum)
   THERAPY PROGRESS NOTE  Session Time: 10 am - 11 am; 60 minutes Participation Level: Active Behavioral Response: CasualAlertAnxious Type of Therapy: Individual Therapy Treatment Goals addressed: Anxiety   Purpose: MSW Intern Lauree Chandler met with the patient for routine individual therapy to work towards treatment goals: managing anxiety symptoms, increasing social support, establishing healthy boundaries, and practicing self-care.  Intervention: MSW Intern Lauree Chandler met with the patient for their second therapy session together to work on discussing his anxiety symptoms, difficult family relationships, and increasing his overall social support. Intern began with a check-in to see how the patient had been feeling and assessed for any significant events over the last week.The patient said he was feeling okay, but he had been bothered quite often during the week from former friends calling him and asking him for favors. Intern provided reflective listening skills as the patient discussed how these experiences made him feel. Intern utilized the intervention of CBT, strengths-based and supportive counseling to decrease anxiety symptoms and help him work through whether his existing friendships were healthy or not. MSW Intern assessed for SI/HI/command psychosis.  Effectiveness: Patient is alert x4 affect. Patient identified he was okay, but he had been frustrated by seeing a pattern of his friends only reaching out when they needed something from him. Patient participated in the session by sharing how his friends would call him, ask him for a favor, and then never return any favors or interest back to him. He felt like he was taken advantage of and he had isolated himself to keep from speaking with these individuals. The Intern asked whether he still had others to talk to, and he shared that he had many older friends who were good support systems that he spoke with throughout the week. He enjoyed their  friendship, but he did not feel like he needed to socialize with the former friends who were upsetting him any more. The patient explained how he had stopped answering their calls, but it was still annoying to see their names pop up when they called or messaged him. The Intern asked how he might could resolve these feelings, so they worked out a plan together for the patient to block these individuals' numbers so he no longer had to see them on his phone. The Intern praised the client for establishing healthy boundaries between himself and individuals who no longer served him.  The Intern asked the patient how he had been prioritizing himself and practicing self-care while dealing with the frustrations over his friends during the week. The patient said he had been enjoying listening to music and watching some television. The Intern praised him for being able to focus on himself and reiterated that self-care is never selfish but is actually essential for his wellbeing. The Intern challenged the patient to continue exploring ways he could care for himself and find new things he may like to do during the following week.  Intervention was effective as patient was able to reflect. Progress towards goal is Ongoing. Patient denied active suicidal/homicidal/active psychosis.  Plan Patient offered next appointment for: 10 am on October 23, 2020.  Diagnosis: Generalized Anxiety Disorder    Roxana Hires 10/23/2020

## 2020-10-30 ENCOUNTER — Other Ambulatory Visit: Payer: Self-pay

## 2020-10-30 ENCOUNTER — Ambulatory Visit (INDEPENDENT_AMBULATORY_CARE_PROVIDER_SITE_OTHER): Payer: Medicaid Other | Admitting: Clinical

## 2020-10-30 DIAGNOSIS — F411 Generalized anxiety disorder: Secondary | ICD-10-CM | POA: Diagnosis not present

## 2020-10-31 NOTE — Progress Notes (Signed)
THERAPY PROGRESS NOTE  Session Time: 10 - 11 am Participation Level: Active Behavioral Response: CasualAlertAnxious Type of Therapy: Individual Therapy Treatment Goals addressed: Anxiety   Purpose: MSW Intern Lauree Chandler met with client for routine individual therapy to work towards treatment goals: coping with his anxiety about his father, upholding healthy boundaries, and improving family relationships/social support in his life.   Intervention: MSW Intern Lauree Chandler met with the patient to assist him with managing his anxiety systems and discussing his strained/distant family relationships. Intern began with a brief check in to see how the patient was doing and assessed for any significant events. Intern provided reflective listening skills as patient discussed how he had been feeling and the events of the previous week. Intern utilized intervention of CBT, Solution Focused and Strength-based to decrease anxiety/depressive symptoms. Intern utilized handout of the different types of boundaries. Intern assessed for SI/HI/command psychosis.  Effectiveness: Patient is alert x4 affect. Patient identified he has been feeling okay throughout the week, but he had not heard from his son. Patient participated in session sharing that he felt vindicated in expecting to be disappointed by his son's behavior when that was what had happened. His son had previously told him that he wanted to work on his car with his father and spend time together, but the patient doubted this because he never heard back from his son about this project during the week. The patient stated that this supported his opinion that most people are untrustworthy and have poor intentions in regards to him and his effort/time. The Intern reported that she was sorry to hear that about his son, but she wondered whether the patient was really as finished with the son as he made it seem. The Intern pointed out that even though the patient said he  was done pursuing a relationship with his son, it still seemed like he cared for him when he mentioned how he believed his son was going down a dark path and struggling in his life.   The patient shared that he had previously had a similar paternal relationship with one of his former neighbors about twenty years ago. 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 When the boy grew up and turned 24, the patient revealed that the young man had committed suicide. The patient disclosed that he had always felt guilty about this event, and the Intern asked whether he had these same concerns over his own son. The patient said he would think about this.  The Intern printed out a handout about the different types of boundaries and provided it to the client. The Intern wanted to demonstrate to the client that while he had practiced setting healthy boundaries, there were times when boundaries could be too rigid to where someone would be unable to form new relationships or experience new things. The Intern started going over the handout with the client to see if any of the exercises resonated with him, but then he started speaking about another topic. The Intern assigned the rest of the worksheet to the client as "homework" to finish filling out on his own and bring back for their session the following week.  Intervention was effective as patient was able to reflect. Progress towards goal is Ongoing. Patient denied active suicidal/homicidal/active psychosis.  Plan Patient offered next appointment for: 11/06/2020 at 10 am.  Diagnosis: Generalized Anxiety Disorder  Roxana Hires 10/31/2020

## 2020-11-06 ENCOUNTER — Ambulatory Visit (INDEPENDENT_AMBULATORY_CARE_PROVIDER_SITE_OTHER): Payer: Medicaid Other | Admitting: Clinical

## 2020-11-06 DIAGNOSIS — F411 Generalized anxiety disorder: Secondary | ICD-10-CM

## 2020-11-07 NOTE — Progress Notes (Signed)
° °  THERAPY PROGRESS NOTE  Session Time: 10 am - 11 am; 60 minutes Participation Level: Active Behavioral Response: CasualAlertAnxious and Dysphoric Type of Therapy: Individual Therapy Treatment Goals addressed: Anxiety   Purpose: Social work Risk manager met with client for routine individual therapy to work towards treatment goals: managing anxiety symptoms and learning healthy coping skills, and working on repairing/processing difficult family relationships.  Intervention: Intern met with the patient to continue working on managing his anxiety symptoms and processing his strained family relationships. Intern started with a brief check in to see how the patient was doing and assessed for any significant events. Intern provided reflective listening skills as patient discussed how he had felt the last week and the events that had occurred. Intern utilized intervention of CBT, Motivational Interviewing and Strength-based to decrease anxiety/depressive symptoms. Intern assessed for SI/HI/command psychosis.  Effectiveness: Patient is alert x4 affect. Patient identified he was fine after the previous week. Patient participated in session sharing that he had started working on an old truck of his, and that he enjoyed it as a new hobby. The Intern congratulated this since the patient was able to continue doing something he enjoyed but in his own, therapeutic way.  The patient said he had received a phone call from his son, but nothing came from it. His son was supposed to visit him but he never did, and the patient said he was unsurprised and unbothered by this.  The Intern tried to gauge whether the patient genuinely meant this because he felt it was significant enough to mention in the session. Then, they discussed the patient's former friend/apprentice who he viewed as a son until the young man committed suicide. The patient disclosed that he still missed this gentleman very much, and the Intern asked  whether he held on to any guilt over the man's early death. The patient said that he still grappled with whether he could have prevented the man's actions, so the Intern spoke with him about the emotion of guilt and whether it was misplaced or not to help him better process the loss.  The intern had the patient once again discuss the idea of others being "jealous" of him. The intern wanted to better understand where this idea originated from, so she asked for specific examples of former friends/colleagues who had made the patient feel this way. The patient could not provide any examples, but he compared it to feeling like others took advantage of him/leveraged him or his skills to receive something they wanted.   From the previous conversation, the intern described a spectrum where one side was being arrogant about someone's abilities or skills and the other was being proud of them. The intern challenged the patient to see where he viewed himself on this spectrum vs. How he felt like others viewed him. 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 The intern will try and continue this discussion during the next session to try and determine where these feelings are coming from.  Intervention was effective as patient was able to reflect. Progress towards goal is Ongoing. Patient denied active suicidal/homicidal/active psychosis.  Plan Patient offered next appointment for: 11/20/2020 at 10 am.  Diagnosis: Generalized Anxiety Disorder    Roxana Hires 11/07/2020

## 2020-11-12 NOTE — Addendum Note (Signed)
Addended by: Theodoro Clock on: 11/12/2020 04:55 PM   Modules accepted: Level of Service

## 2020-11-20 ENCOUNTER — Other Ambulatory Visit: Payer: Medicaid Other | Admitting: Clinical

## 2020-11-21 ENCOUNTER — Ambulatory Visit (INDEPENDENT_AMBULATORY_CARE_PROVIDER_SITE_OTHER): Payer: Medicaid Other | Admitting: Clinical

## 2020-11-21 ENCOUNTER — Telehealth: Payer: Self-pay

## 2020-11-21 DIAGNOSIS — F411 Generalized anxiety disorder: Secondary | ICD-10-CM

## 2020-11-21 NOTE — Telephone Encounter (Signed)
MSW Intern Loletta Specter called the patient on 12/1 to see if he would be able to reschedule their appointment on 12/1 for 12/2 at any time. The patient agreed and they made an appointment for him to come to a counseling session on 12/2 at 10 am.

## 2020-11-28 ENCOUNTER — Ambulatory Visit (INDEPENDENT_AMBULATORY_CARE_PROVIDER_SITE_OTHER): Payer: Medicaid Other | Admitting: Clinical

## 2020-11-28 DIAGNOSIS — F411 Generalized anxiety disorder: Secondary | ICD-10-CM

## 2020-11-28 NOTE — Progress Notes (Signed)
   THERAPY PROGRESS NOTE  Session Time: 10-11 am Participation Level: Active Behavioral Response: Casual and Fairly GroomedAlertIrritable Type of Therapy: Individual Therapy Treatment Goals addressed: Anxiety and Coping   Purpose: MSW Intern met with client for routine individual therapy to work towards treatment goals: managing anxiety symptoms and intrusive thoughts, upholding boundaries, working through difficult family relationships.  Intervention: Intern met with the patient and began with a brief check in to see how the patient was doing and assessed for any significant events. Intern provided reflective listening skills as patient discussed how he had been feeling over the holiday (Thanksgiving) and how he had spent his time. Intern utilized intervention of CBT, Solution Focused and Strength-based to decrease anxiety/depressive symptoms. Intern assessed for SI/HI/command psychosis.  Effectiveness: Patient is alert x4 affect. Patient identified he had been struggling with his anxiety and felt a lot of nerves lately. Patient participated in session sharing that he would be going throughout his daily life and then something would trigger his anxiety and nervous thoughts flooded into his mind. He explained how these thoughts interrupted his day and affected his emotional state more than he would like them to. The intern asked whether he was doing anything that might bring up those thoughts for him, but the patient said he was unaware of what could be doing it.   In order to address those nervous, intrusive thoughts, the intern encouraged the patient to pay attention over the next week when those thoughts came into his mind. She explained that if he was able to acknowledge when and how they appeared and sat with them instead of trying to ignore them, then he would be able to start understanding what was behind them. She emphasized that this would be a good starting point for learning how to manage  them and his anxiety as a whole in the long run. She provided a mindfulness example by saying he could visualize himself as a mountain while his thoughts were the clouds above: the clouds would continue to pass, but the mountain does not move from its foundation.    Intervention was effective as patient was able to reflect. Progress towards goal is Ongoing. Patient denied active suicidal/homicidal/active psychosis.  Plan Patient offered next appointment for: 11/28/2020 at 10 am  Diagnosis: Generalized Anxiety Disorder    Roxana Hires 11/28/2020

## 2020-11-28 NOTE — Progress Notes (Signed)
   THERAPY PROGRESS NOTE  Session Time: 10 am - 11 am Participation Level: Active Behavioral Response: Casual and Fairly GroomedAlertAnxious and Irritable Type of Therapy: Individual Therapy Treatment Goals addressed: Anxiety and Coping   Purpose: MSW intern Lauree Chandler met with client for routine individual therapy to work towards treatment goals: managing anxiety symptoms and intrusive thoughts, establishing and upholding boundaries, working through difficult family relationships.  Intervention: MSW Intern began with a brief check-in and assessed how the patient was doing, as well as assessed for any significant events. Intern provided reflective listening as the patient talked about how his nerves had continued to bother him throughout the week and how he had struggled to manage his anxiety. Intern utilized intervention of CBT, Solution Focused and Strength-based to decrease anxiety/depressive symptoms. Intern utilized a mindfulness handout that explained the need to stop, process, and then plan whenever the patient sensed intrusive thoughts. Intern assessed for SI/HI/command psychosis.  Effectiveness: Patient is alert x4 affect. Patient identified he was doing okay, but he had a difficult time managing his nerves during the week. Patient participated in session sharing that he tried to practice the skill she had taught him about noticing and investigating his intrusive thoughts, but he had no answers from them so far. He explained how he used to distract himself with work 24/7 so he would not have to consider what he had endured or worried about, but now that he could no longer work such physically arduous jobs, he was left with his own thoughts. The intern suggested that maybe the patient needed a new kind of outlet to channel his energy and focus into but not one that would distract him from dealing with his emotions like he had previously done. She explained that if he continued to do so, his  thoughts would keep building up until he could no longer control them.   The intern also suggested that the patient might benefit from learning how to sit in his newly relaxed life. She demonstrated how he had previously been busy all throughout his days and how this caused his body to not know how to actually relax itself and the mind. She encouraged him to keep practicing the mindfulness skill that she taught him last week, and he explained how he had been doing so. She praised his efforts and then stated how he could build on them, such as setting aside an intentional time during each day to be alone with his thoughts (meditate). She explained how he could also download meditation apps on his phone, watch videos on the practice on Youtube, or even look into buying a meditation book or journal. She provided several resources and suggestions that had worked well for herself in the past, and he said he would try them and let her know how they worked the next week.    Intervention was effective as patient was able to reflect. Progress towards goal is Ongoing. Patient denied active suicidal/homicidal/active psychosis.  Plan Patient offered next appointment for: 12/16 at 10 am.  Diagnosis: Generalized Anxiety Disorder   MSW Intern, Roxana Hires 11/28/2020

## 2020-11-29 ENCOUNTER — Other Ambulatory Visit: Payer: Self-pay | Admitting: Internal Medicine

## 2020-11-29 MED ORDER — CYCLOBENZAPRINE HCL 10 MG PO TABS
ORAL_TABLET | ORAL | 1 refills | Status: DC
Start: 2020-11-29 — End: 2021-01-08

## 2020-11-29 NOTE — Addendum Note (Signed)
Addended by: Walther Sanagustin P on: 11/29/2020 09:13 AM   Modules accepted: Level of Service  

## 2020-11-29 NOTE — Addendum Note (Signed)
Addended by: Theodoro Clock on: 11/29/2020 09:19 AM   Modules accepted: Level of Service

## 2020-11-29 NOTE — Telephone Encounter (Signed)
Call patient and notified him that his prescription was sent.

## 2020-12-05 ENCOUNTER — Other Ambulatory Visit: Payer: Medicaid Other | Admitting: Clinical

## 2020-12-12 ENCOUNTER — Other Ambulatory Visit: Payer: Medicaid Other | Admitting: Clinical

## 2020-12-20 ENCOUNTER — Ambulatory Visit: Payer: Medicaid Other | Admitting: Internal Medicine

## 2020-12-30 ENCOUNTER — Ambulatory Visit: Payer: Medicaid Other | Admitting: Internal Medicine

## 2021-01-01 ENCOUNTER — Other Ambulatory Visit: Payer: Self-pay

## 2021-01-01 ENCOUNTER — Ambulatory Visit (INDEPENDENT_AMBULATORY_CARE_PROVIDER_SITE_OTHER): Payer: Medicaid Other | Admitting: Clinical

## 2021-01-01 DIAGNOSIS — F411 Generalized anxiety disorder: Secondary | ICD-10-CM | POA: Diagnosis not present

## 2021-01-01 NOTE — Progress Notes (Signed)
   THERAPY PROGRESS NOTE  Session Time: 11 am - 12 pm; 60 minutes Participation Level: Active Behavioral Response: CasualAlertAnxious and Irritable Type of Therapy: Individual Therapy Treatment Goals addressed: Anger, Anxiety and Coping   Purpose: MSW Intern Lauree Chandler met with patient for routine individual therapy to work towards treatment goals: managing anxiety symptoms, regulating negative emotional responses, establishing and upholding boundaries, and working through difficult family relationships and friendships.   Intervention: MSW Intern began the session with a brief check in and assessed for any significant events and how the patient was doing today. The patient appeared very agitated and anxious, and the intern could see it was difficult for him to catch his breath. He reported feeling frustrated lately with a number of different tasks and having to deal with former friends. Intern utilized intervention of CBT, Solution Focused, Strength-based and Supportive to decrease anxiety/depressive symptoms. Intern assessed for SI/HI/command psychosis.  Effectiveness: Patient is alert x4 affect. Patient identified he is feeling much more anxious than usual and was finding it hard to regulate his breathing and calm down in general. Patient participated in session sharing that he had unwanted individuals re-enter his life recently, and it had negatively affected him. He reported being unable to control his intrusive thoughts, and he was afraid the situation would continue to escalate. The intern reminded that patient that he had skills at his disposal from their sessions that he could use to exert control over his interactions with the individuals. She explained that his emotions were completely valid, but it was important to remember to not act out of them or let them dominate how his days went.   Together, the patient and the intern brainstormed about how he could productively release the anxiety and  tension he was struggling with. The intern suggested taking walks outside in local parks or environmental centers, since the patient mentioned before that he enjoyed being in nature. The intern also encouraged him to continue practicing his self-care through journaling, reading, and listening to music that he enjoyed.   To conclude the session, the intern had the patient complete a review of his year in 2021. They wrote down things that he wanted to continue in the new year and those that he wanted to leave behind. Next, they discussed what he wanted to start and focus on in 2022. When they were finished with the list, the intern gave it to the patient and assigned him to do at least one of the tasks they had written down on the 2022 column. She challenged him to do something out of his comfort zone that would prioritize his wellbeing in a new and exciting way, such as taking his dog for a hike or researching more mindfulness techniques.    Intervention was effective as patient was able to reflect and reported that he felt much better after speaking to the intern. His breathing seemed more steady upon leaving as well. Progress towards goal is Ongoing. Patient denied active suicidal/homicidal/active psychosis.  Plan Patient offered next appointment for: 01/09/2020.  Diagnosis: Generalized Anxiety Disorder   MSW Intern, Roxana Hires 01/01/2021

## 2021-01-03 NOTE — Addendum Note (Signed)
Addended by: Theodoro Clock on: 01/03/2021 02:20 PM   Modules accepted: Level of Service

## 2021-01-07 ENCOUNTER — Ambulatory Visit (AMBULATORY_SURGERY_CENTER): Payer: Self-pay

## 2021-01-07 ENCOUNTER — Other Ambulatory Visit: Payer: Self-pay

## 2021-01-07 VITALS — Ht 68.5 in | Wt 180.0 lb

## 2021-01-07 DIAGNOSIS — Z1211 Encounter for screening for malignant neoplasm of colon: Secondary | ICD-10-CM

## 2021-01-07 MED ORDER — SUTAB 1479-225-188 MG PO TABS: 1.0000 | ORAL_TABLET | ORAL | 0 refills | Status: DC

## 2021-01-07 NOTE — Progress Notes (Signed)
Pre visit completed via phone; patient verified name, DOB, and address; No egg or soy allergy known to patient  No issues with past sedation with any surgeries or procedures No intubation problems in the past --never had any surgery No FH of Malignant Hyperthermia No diet pills per patient No home 02 use per patient  No blood thinners per patient  Pt denies issues with constipation  No A fib or A flutter   COVID 19 guidelines implemented in [facility] today with Pt and RN  Pt is fully vaccinated  for Covid  Coupon given to pt in [facility] today , Code to Pharmacy  Due to the COVID-19 pandemic we are asking patients to follow certain guidelines.  Pt aware of COVID protocols and [facility] guidelines

## 2021-01-08 ENCOUNTER — Other Ambulatory Visit: Payer: Self-pay | Admitting: Clinical

## 2021-01-08 ENCOUNTER — Other Ambulatory Visit: Payer: Self-pay | Admitting: Internal Medicine

## 2021-01-08 ENCOUNTER — Telehealth: Payer: Self-pay

## 2021-01-08 MED ORDER — CYCLOBENZAPRINE HCL 10 MG PO TABS
ORAL_TABLET | ORAL | 1 refills | Status: DC
Start: 2021-01-08 — End: 2021-03-19

## 2021-01-08 NOTE — Telephone Encounter (Signed)
MSW Intern Loletta Specter tried to call the patient to ask whether he would be attending their weekly counseling session, but he was unable to answer the phone. The voicemail box was full and could not recieve another answering message.

## 2021-01-15 ENCOUNTER — Ambulatory Visit (INDEPENDENT_AMBULATORY_CARE_PROVIDER_SITE_OTHER): Payer: Medicaid Other | Admitting: Student

## 2021-01-15 ENCOUNTER — Encounter: Payer: Self-pay | Admitting: Student

## 2021-01-15 ENCOUNTER — Other Ambulatory Visit: Payer: Self-pay

## 2021-01-15 VITALS — BP 133/95 | HR 86 | Temp 98.0°F | Ht 68.0 in | Wt 182.9 lb

## 2021-01-15 DIAGNOSIS — H539 Unspecified visual disturbance: Secondary | ICD-10-CM | POA: Diagnosis not present

## 2021-01-15 DIAGNOSIS — G8929 Other chronic pain: Secondary | ICD-10-CM

## 2021-01-15 DIAGNOSIS — M25512 Pain in left shoulder: Secondary | ICD-10-CM | POA: Diagnosis present

## 2021-01-15 DIAGNOSIS — H538 Other visual disturbances: Secondary | ICD-10-CM

## 2021-01-15 DIAGNOSIS — G250 Essential tremor: Secondary | ICD-10-CM | POA: Diagnosis not present

## 2021-01-15 DIAGNOSIS — F419 Anxiety disorder, unspecified: Secondary | ICD-10-CM

## 2021-01-15 DIAGNOSIS — F32A Depression, unspecified: Secondary | ICD-10-CM | POA: Diagnosis not present

## 2021-01-15 DIAGNOSIS — Z639 Problem related to primary support group, unspecified: Secondary | ICD-10-CM

## 2021-01-15 MED ORDER — PROPRANOLOL HCL ER 60 MG PO CP24: 60.0000 mg | ORAL_CAPSULE | Freq: Two times a day (BID) | ORAL | 2 refills | Status: DC

## 2021-01-15 MED ORDER — ESCITALOPRAM OXALATE 10 MG PO TABS: 10.0000 mg | ORAL_TABLET | Freq: Every day | ORAL | 2 refills | Status: DC

## 2021-01-15 NOTE — Patient Instructions (Addendum)
It was a pleasure seeing you in clinic. Today we discussed:   Left shoulder pain: I have made a referral for physical therapy for you pain   Anxiety: Please start taking escitalopram 10 mg daily for anxiety. I have also made a refer for behavioral health to discuss therapy  Tremors in hands: Please start taking propanolol 60 mg 2 times daily   Vision changes: I have put in a refferal to ophthalmology   If you have any questions or concerns, please call our clinic at [phone] between 9am-5pm and after hours call [phone] and ask for the internal medicine resident on call. If you feel you are having a medical emergency please call 911.   Thank you, we look forward to helping you remain healthy!

## 2021-01-16 ENCOUNTER — Telehealth: Payer: Self-pay

## 2021-01-16 DIAGNOSIS — H538 Other visual disturbances: Secondary | ICD-10-CM | POA: Insufficient documentation

## 2021-01-16 DIAGNOSIS — F419 Anxiety disorder, unspecified: Secondary | ICD-10-CM | POA: Insufficient documentation

## 2021-01-16 DIAGNOSIS — M25512 Pain in left shoulder: Secondary | ICD-10-CM | POA: Insufficient documentation

## 2021-01-16 NOTE — Assessment & Plan Note (Signed)
Patient reports tremor in hands for the past 3 years. 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 Formerly on metoprolol without improvement and switched to primidone however he notes headaches while taking primidone and stopped taking this. On exam no tremors at rest but tremors when lifting or moving hands. No head tremors. Will try him on propranolol for this.   Plan Propranolol 60 mg twice daily May need referral to neurology if this does not improve

## 2021-01-16 NOTE — Assessment & Plan Note (Addendum)
Patient reports significant anxiety that has been chronic. GAD-7 today is 21. Feels that anxiety is often due to family stressors due to financial disputes with father, children, and former spouse. Often feels restless, has difficulty falling asleep for which Jeffery Goodman takes trazadone, difficulty concentrating, and often has feeling of muscle spasms in shoulders. Has been seeing a IT sales professional through family medicine with prior PCP. As Jeffery Goodman is establishing with our clinic will refer him to behavioral health for therapy and medical management. Will start him on escitalopram 10 mg daily as anxiety is not controlled.   Plan Start lexapro 10 mg daily Trazadone 150 mg at bedtime Referral to IBH Follow up in 2-4 weeks

## 2021-01-16 NOTE — Assessment & Plan Note (Signed)
Patient complains of blurred vision and floaters in left eye. 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 Had referral to ophthalmology for this with appointment on 01/08/2021 but he was unaware and did not go.  Plan Referral to opthalmology
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
## 2021-01-16 NOTE — Telephone Encounter (Signed)
Patient presented to front desk requesting to see a nurse.  Pt informs RN he saw Dr. Elaina Pattee yesterday and took Lexapro and propranolol as directed.  He states he is now "very jittery, about to jump out of my skin, my heart is beating fast, I am having a reaction to the medication".  RN obtains V/S :138/90, P-62, O2 100%, no respiratory distress noted.  Pt very jittery.  Dr. Elaina Pattee notified, her schedule is full today, but she can see patient on Monday, states patient maybe having a panic attack.  RN informed patient based on his v/s this doesn't appear to be a medication reaction and he might be having a panic attack.  Informed pt he does not have to continue newly prescribed medications from yesterday and can see Dr. Elaina Pattee on Monday at 1:45 for folllow up, he is agreeable.  Breathing exercises discussed with patient and he leaves clinic in stable condition. SChaplin, RN,BSN
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
## 2021-01-20 ENCOUNTER — Encounter: Payer: Medicaid Other | Admitting: Student

## 2021-01-21 ENCOUNTER — Encounter: Payer: Self-pay | Admitting: Student

## 2021-01-21 ENCOUNTER — Other Ambulatory Visit: Payer: Self-pay

## 2021-01-21 ENCOUNTER — Ambulatory Visit (AMBULATORY_SURGERY_CENTER): Payer: Medicaid Other | Admitting: Internal Medicine

## 2021-01-21 ENCOUNTER — Encounter: Payer: Self-pay | Admitting: Internal Medicine

## 2021-01-21 VITALS — BP 121/82 | HR 70 | Temp 98.0°F | Resp 10 | Ht 68.5 in | Wt 180.0 lb

## 2021-01-21 DIAGNOSIS — Z1211 Encounter for screening for malignant neoplasm of colon: Secondary | ICD-10-CM | POA: Diagnosis not present

## 2021-01-21 MED ORDER — SODIUM CHLORIDE 0.9 % IV SOLN: 500.0000 mL | Freq: Once | INTRAVENOUS | Status: DC

## 2021-01-21 NOTE — Patient Instructions (Signed)
Discharge instructions given. Normal exam. Resume previous medications. YOU HAD AN ENDOSCOPIC PROCEDURE TODAY AT THE [facility]:   Refer to the procedure report that was given to you for any specific questions about what was found during the examination.  If the procedure report does not answer your questions, please call your gastroenterologist to clarify.  If you requested that your care partner not be given the details of your procedure findings, then the procedure report has been included in a sealed envelope for you to review at your convenience later.  YOU SHOULD EXPECT: Some feelings of bloating in the abdomen. Passage of more gas than usual.  Walking can help get rid of the air that was put into your GI tract during the procedure and reduce the bloating. If you had a lower endoscopy (such as a colonoscopy or flexible sigmoidoscopy) you may notice spotting of blood in your stool or on the toilet paper. If you underwent a bowel prep for your procedure, you may not have a normal bowel movement for a few days.  Please Note:  You might notice some irritation and congestion in your nose or some drainage.  This is from the oxygen used during your procedure.  There is no need for concern and it should clear up in a day or so.  SYMPTOMS TO REPORT IMMEDIATELY:  Following lower endoscopy (colonoscopy or flexible sigmoidoscopy):  Excessive amounts of blood in the stool  Significant tenderness or worsening of abdominal pains  Swelling of the abdomen that is new, acute  Fever of 100F or higher   For urgent or emergent issues, a gastroenterologist can be reached at any hour by calling [phone]. Do not use MyChart messaging for urgent concerns.    DIET:  We do recommend a small meal at first, but then you may proceed to your regular diet.  Drink plenty of fluids but you should avoid alcoholic beverages for 24 hours.  ACTIVITY:  You should plan to take it easy for the rest of  today and you should NOT DRIVE or use heavy machinery until tomorrow (because of the sedation medicines used during the test).    FOLLOW UP: Our staff will call the number listed on your records 48-72 hours following your procedure to check on you and address any questions or concerns that you may have regarding the information given to you following your procedure. If we do not reach you, we will leave a message.  We will attempt to reach you two times.  During this call, we will ask if you have developed any symptoms of COVID 19. If you develop any symptoms (ie: fever, flu-like symptoms, shortness of breath, cough etc.) before then, please call [phone].  If you test positive for Covid 19 in the 2 weeks post procedure, please call and report this information to us.    If any biopsies were taken you will be contacted by phone or by letter within the next 1-3 weeks.  Please call us at [phone] if you have not heard about the biopsies in 3 weeks.    SIGNATURES/CONFIDENTIALITY: You and/or your care partner have signed paperwork which will be entered into your electronic medical record.  These signatures attest to the fact that that the information above on your After Visit Summary has been reviewed and is understood.  Full responsibility of the confidentiality of this discharge information lies with you and/or your care-partner.  

## 2021-01-21 NOTE — Progress Notes (Signed)
Internal Medicine Clinic Attending  Case discussed with Dr. Elaina Pattee  At the time of the visit.  We reviewed the resident's history and exam and pertinent patient test results.  I agree with the assessment, diagnosis, and plan of care documented in the resident's note.   Patient has no known past surgeries.

## 2021-01-21 NOTE — Op Note (Signed)
[facility] Patient Name: Jeffery Goodman Procedure Date: 01/21/2021 11:43 AM MRN: [id number] Endoscopist: Beverley Fiedler , MD Age: 60 Referring MD:  Date of Birth: May 28, 1961 Gender: Male Account #: [id number] Procedure:                Colonoscopy Indications:              Screening for colorectal malignant neoplasm, This                            is the patient's first colonoscopy Medicines:                Monitored Anesthesia Care Procedure:                Pre-Anesthesia Assessment:                           - Prior to the procedure, a History and Physical                            was performed, and patient medications and                            allergies were reviewed. The patient's tolerance of                            previous anesthesia was also reviewed. The risks                            and benefits of the procedure and the sedation                            options and risks were discussed with the patient.                            All questions were answered, and informed consent                            was obtained. Prior Anticoagulants: The patient has                            taken no previous anticoagulant or antiplatelet                            agents. ASA Grade Assessment: II - A patient with                            mild systemic disease. After reviewing the risks                            and benefits, the patient was deemed in                            satisfactory condition to undergo the procedure.  After obtaining informed consent, the colonoscope                            was passed under direct vision. Throughout the                            procedure, the patient's blood pressure, pulse, and                            oxygen saturations were monitored continuously. The                            Olympus CF-HQ190L ([id number]) Colonoscope was                            introduced through the anus and  advanced to the                            cecum, identified by appendiceal orifice and                            ileocecal valve. The colonoscopy was performed                            without difficulty. The patient tolerated the                            procedure well. The quality of the bowel                            preparation was good. The ileocecal valve,                            appendiceal orifice, and rectum were photographed. Scope In: 11:45:40 AM Scope Out: 12:01:26 PM Scope Withdrawal Time: 0 hours 10 minutes 27 seconds  Total Procedure Duration: 0 hours 15 minutes 46 seconds  Findings:                 The digital rectal exam was normal.                           The entire examined colon appeared normal on direct                            and retroflexion views. Complications:            No immediate complications. Estimated Blood Loss:     Estimated blood loss: none. Impression:               - The entire examined colon is normal on direct and                            retroflexion views.                           - No specimens collected. Recommendation:           -  Patient has a contact number available for                            emergencies. The signs and symptoms of potential                            delayed complications were discussed with the                            patient. Return to normal activities tomorrow.                            Written discharge instructions were provided to the                            patient.                           - Resume previous diet.                           - Continue present medications.                           - Repeat colonoscopy in 10 years for screening                            purposes. Beverley Fiedler, MD 01/21/2021 12:03:49 PM This report has been signed electronically.

## 2021-01-21 NOTE — Progress Notes (Signed)
To pacu, VSS. Report to [email or web address].tb 

## 2021-01-21 NOTE — Progress Notes (Signed)
Pt's states no medical or surgical changes since previsit or office visit. 

## 2021-01-23 ENCOUNTER — Telehealth: Payer: Self-pay

## 2021-01-23 NOTE — Telephone Encounter (Signed)
Attempted to call for follow up, no answer and voicemail full. Will try again this afternoon.

## 2021-01-23 NOTE — Telephone Encounter (Signed)
  Follow up Call-  Call back number 01/21/2021  Post procedure Call Back phone  # [phone]  Permission to leave phone message Yes  Some recent data might be hidden     Patient questions:  Do you have a fever, pain , or abdominal swelling? No. Pain Score  0 *  Have you tolerated food without any problems? Yes.    Have you been able to return to your normal activities? Yes.    Do you have any questions about your discharge instructions: Diet   No. Medications  No. Follow up visit  No.  Do you have questions or concerns about your Care? No.  Actions: * If pain score is 4 or above: No action needed, pain <4. 1. Have you developed a fever since your procedure? no  2.   Have you had an respiratory symptoms (SOB or cough) since your procedure? no  3.   Have you tested positive for COVID 19 since your procedure no  4.   Have you had any family members/close contacts diagnosed with the COVID 19 since your procedure?  no   If yes to any of these questions please route to Laverna Peace, RN and Karlton Lemon, RN

## 2021-01-29 ENCOUNTER — Ambulatory Visit: Payer: Medicaid Other | Admitting: Behavioral Health

## 2021-01-29 ENCOUNTER — Encounter: Payer: Self-pay | Admitting: Internal Medicine

## 2021-01-29 ENCOUNTER — Ambulatory Visit (INDEPENDENT_AMBULATORY_CARE_PROVIDER_SITE_OTHER): Payer: Medicaid Other | Admitting: Internal Medicine

## 2021-01-29 VITALS — BP 120/90 | HR 87 | Temp 98.4°F | Ht 68.0 in | Wt 183.7 lb

## 2021-01-29 DIAGNOSIS — F419 Anxiety disorder, unspecified: Secondary | ICD-10-CM

## 2021-01-29 DIAGNOSIS — R946 Abnormal results of thyroid function studies: Secondary | ICD-10-CM | POA: Diagnosis not present

## 2021-01-29 DIAGNOSIS — F321 Major depressive disorder, single episode, moderate: Secondary | ICD-10-CM

## 2021-01-29 DIAGNOSIS — L539 Erythematous condition, unspecified: Secondary | ICD-10-CM

## 2021-01-29 DIAGNOSIS — L309 Dermatitis, unspecified: Secondary | ICD-10-CM | POA: Diagnosis not present

## 2021-01-29 MED ORDER — DULOXETINE HCL 30 MG PO CPEP: 30.0000 mg | ORAL_CAPSULE | Freq: Every day | ORAL | 2 refills | Status: DC

## 2021-01-29 NOTE — Assessment & Plan Note (Signed)
Patient was seen in clinic on January 27 with significant anxiety and GAD-7: 21.  He was a started on escitalopram 10 mg daily and trazodone 150 mg at bedtime.  He is here today for follow-up. He states that he only took Lexapro for 3 days because it made him feel bad and felt like "he wanted jump out of his skin".  He had an appointment with Dr. Monna Fam today.   -Will stop Escitalopram to Duloxetine 30 mg QD -F/u in clinic in 2 weeks and will increase Duloxetine dose if needed -Continue proprnolol and Trazodone PRN for insomnia -Continue f/u with IBH

## 2021-01-29 NOTE — Patient Instructions (Signed)
Thank you for allowing [place] to provide your care today.  Discussed your anxiety, medication side effect and also your skin redness.  1-stop taking escitalopram 2-start taking duloxetine 30 mg.  Take 1 tablet once a day 3-follow-up in clinic in 2-4 weeks 4-I have ordered some blood work for work-up of your skin changes.  I will call you with the result. 5-please take rest of your medications as before  Please come back to clinic in 2 weeks or earlier as needed.  Should you have any questions or concerns please call the internal medicine clinic at [phone].    Thank you!

## 2021-01-29 NOTE — Assessment & Plan Note (Addendum)
Patient complains of worsening of chronic skin erythema and states that he feels his skin is on fire.  It interrupts his sleep sometimes. He states that he has been a chronic issue.  He has triamcinolone cream every day but he does not think it helped him. He denies photosensitivity, he has some joint pain but he thinks it is because of his arthritis. On exam, he has nonblanching erythema on his hands and face, including his nasal folds.  Lesion.  No joint tenderness.  Extensors are intact.  Given the pattern of erythema and his skin finding, will start work-up for dermatomyositis.  -CK -ESR, CRP, TSH -CBC and CMP -Follow-up in clinic in 2 weeks
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
## 2021-01-29 NOTE — Assessment & Plan Note (Signed)
Patient complains of worsening of chronic skin erythema and states that he feels his skin is on fire.  It interrupts his sleep sometimes. He states that he has been a chronic issue.  He has triamcinolone cream every day but he does not think it helped him. He denies photosensitivity, he has some joint pain but he thinks it is because of his arthritis. On exam, he has erythema on his hands and face, including his nasal folds.  Lesion.  No joint tenderness.  Extensors are intact.

## 2021-01-30 LAB — C-REACTIVE PROTEIN: CRP: 2 mg/L (ref 0–10)

## 2021-01-30 LAB — CBC WITH DIFFERENTIAL/PLATELET
Basophils Absolute: 0.1 10*3/uL (ref 0.0–0.2)
Basos: 1 %
EOS (ABSOLUTE): 0.1 10*3/uL (ref 0.0–0.4)
Eos: 1 %
Hematocrit: 38.6 % (ref 37.5–51.0)
Hemoglobin: 13.3 g/dL (ref 13.0–17.7)
Immature Grans (Abs): 0 10*3/uL (ref 0.0–0.1)
Immature Granulocytes: 0 %
Lymphocytes Absolute: 2.4 10*3/uL (ref 0.7–3.1)
Lymphs: 32 %
MCH: 32.6 pg (ref 26.6–33.0)
MCHC: 34.5 g/dL (ref 31.5–35.7)
MCV: 95 fL (ref 79–97)
Monocytes Absolute: 0.7 10*3/uL (ref 0.1–0.9)
Monocytes: 10 %
Neutrophils Absolute: 4.1 10*3/uL (ref 1.4–7.0)
Neutrophils: 56 %
Platelets: 357 10*3/uL (ref 150–450)
RBC: 4.08 x10E6/uL — ABNORMAL LOW (ref 4.14–5.80)
RDW: 12.5 % (ref 11.6–15.4)
WBC: 7.4 10*3/uL (ref 3.4–10.8)

## 2021-01-30 LAB — CMP14 + ANION GAP
ALT: 15 IU/L (ref 0–44)
AST: 21 IU/L (ref 0–40)
Albumin/Globulin Ratio: 1.9 (ref 1.2–2.2)
Albumin: 4.6 g/dL (ref 3.8–4.9)
Alkaline Phosphatase: 71 IU/L (ref 44–121)
Anion Gap: 16 mmol/L (ref 10.0–18.0)
BUN/Creatinine Ratio: 16 (ref 9–20)
BUN: 20 mg/dL (ref 6–24)
Bilirubin Total: <0.2 mg/dL (ref 0.0–1.2)
CO2: 24 mmol/L (ref 20–29)
Calcium: 9.9 mg/dL (ref 8.7–10.2)
Chloride: 100 mmol/L (ref 96–106)
Creatinine, Ser: 1.25 mg/dL (ref 0.76–1.27)
GFR calc Af Amer: 73 mL/min/{1.73_m2} (ref >59)
GFR calc non Af Amer: 64 mL/min/{1.73_m2} (ref >59)
Globulin, Total: 2.4 g/dL (ref 1.5–4.5)
Glucose: 93 mg/dL (ref 65–99)
Potassium: 4.7 mmol/L (ref 3.5–5.2)
Sodium: 140 mmol/L (ref 134–144)
Total Protein: 7 g/dL (ref 6.0–8.5)

## 2021-01-30 LAB — SEDIMENTATION RATE: Sed Rate: 34 mm/hr — ABNORMAL HIGH (ref 0–30)

## 2021-01-30 LAB — TSH: TSH: 0.972 u[IU]/mL (ref 0.450–4.500)

## 2021-01-30 LAB — CK: Total CK: 184 U/L (ref 41–331)

## 2021-01-30 NOTE — Progress Notes (Signed)
Internal Medicine Clinic Attending  Case discussed with Dr. Masoudi  At the time of the visit.  We reviewed the resident's history and exam and pertinent patient test results.  I agree with the assessment, diagnosis, and plan of care documented in the resident's note.  

## 2021-01-31 ENCOUNTER — Other Ambulatory Visit: Payer: Self-pay

## 2021-01-31 ENCOUNTER — Ambulatory Visit: Payer: Medicaid Other | Attending: Internal Medicine

## 2021-01-31 DIAGNOSIS — M25612 Stiffness of left shoulder, not elsewhere classified: Secondary | ICD-10-CM | POA: Insufficient documentation

## 2021-01-31 DIAGNOSIS — G8929 Other chronic pain: Secondary | ICD-10-CM | POA: Diagnosis present

## 2021-01-31 DIAGNOSIS — M6281 Muscle weakness (generalized): Secondary | ICD-10-CM | POA: Diagnosis present

## 2021-01-31 DIAGNOSIS — M25512 Pain in left shoulder: Secondary | ICD-10-CM | POA: Diagnosis not present
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
## 2021-02-12 ENCOUNTER — Ambulatory Visit: Payer: Medicaid Other | Admitting: Behavioral Health

## 2021-02-12 DIAGNOSIS — F321 Major depressive disorder, single episode, moderate: Secondary | ICD-10-CM

## 2021-02-12 DIAGNOSIS — F419 Anxiety disorder, unspecified: Secondary | ICD-10-CM

## 2021-02-13 NOTE — BH Specialist Note (Signed)
Integrated Behavioral Health Follow Up In-Person Visit  MRN: [id number] Name: Jeffery Goodman  Number of Integrated Behavioral Health Clinician visits: 2/6 Session Start time: 2:00pm  Session End time: 2:45pm Total time: 45  minutes  Types of Service: Individual psychotherapy  Interpretor:No. Interpretor Name and Language: n/a  Subjective: Jeffery Goodman is a 60 y.o. male accompanied by self Patient was referred by Dr. Quincy Simmonds, MD for mental health adjustment issues. Patient reports the following symptoms/concerns: difficult adjustment to changing health status issues Duration of problem: months; Severity of problem: moderate  Objective: Mood: Depressed and Affect: Appropriate Risk of harm to self or others: No plan to harm self or others  Life Context: Family and Social: Pt has multiple F members that are disengaged from ea other; Pt has not spoken w/Fr in 4 yrs, although he has seen him in public. Pt routinely speaks to his StepMom. School/Work: Pt is currently unable to work & does not attend school Self-Care: Few self-care practices Life Changes: Recent health status changes  Patient and/or Family's Strengths/Protective Factors: Concrete supports in place (healthy food, safe environments, etc.)  Goals Addressed: Patient will: 1.  Reduce symptoms of: depression  2.  Increase knowledge and/or ability of: coping skills and healthy habits  3.  Demonstrate ability to: Increase healthy adjustment to current life circumstances  Progress towards Goals: Ongoing  Interventions: Interventions utilized:  Solution-Focused Strategies Standardized Assessments completed: Not Needed  Patient and/or Family Response: Pt receptive to visit w/Clinician today  Patient Centered Plan: Patient is on the following Treatment Plan(s): Pt will bring photos of his work next session Assessment: Patient currently experiencing elevated anx & depressive response to F Hx of PTSD & Pt's  childhood.   Patient may benefit from cont'd Tx of childhood trauma.  Plan: 1. Follow up with behavioral health clinician on : 3 wks for f:f visit for 30 min 2. Behavioral recommendations: Cont to make notes about thoughts btwn sessions, bring photos of work 3. Referral(s): Integrated [employer] (In Clinic) 4. "From scale of 1-10, how likely are you to follow plan?": 7  Deneise Lever, LMFT

## 2021-02-17 ENCOUNTER — Other Ambulatory Visit: Payer: Self-pay | Admitting: *Deleted

## 2021-02-17 MED ORDER — PROPRANOLOL HCL ER 60 MG PO CP24: 60.0000 mg | ORAL_CAPSULE | Freq: Two times a day (BID) | ORAL | 2 refills | Status: DC

## 2021-02-20 ENCOUNTER — Ambulatory Visit: Payer: Medicaid Other | Attending: Internal Medicine

## 2021-02-20 ENCOUNTER — Other Ambulatory Visit: Payer: Self-pay

## 2021-02-20 DIAGNOSIS — M25512 Pain in left shoulder: Secondary | ICD-10-CM | POA: Insufficient documentation

## 2021-02-20 DIAGNOSIS — M25612 Stiffness of left shoulder, not elsewhere classified: Secondary | ICD-10-CM | POA: Insufficient documentation

## 2021-02-20 DIAGNOSIS — M6281 Muscle weakness (generalized): Secondary | ICD-10-CM | POA: Insufficient documentation

## 2021-02-20 DIAGNOSIS — G8929 Other chronic pain: Secondary | ICD-10-CM | POA: Insufficient documentation
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
## 2021-02-25 ENCOUNTER — Ambulatory Visit: Payer: Medicaid Other

## 2021-02-26 ENCOUNTER — Telehealth: Payer: Self-pay

## 2021-02-26 NOTE — Telephone Encounter (Signed)
No answer, unable to leave message °

## 2021-02-27 ENCOUNTER — Ambulatory Visit: Payer: Medicaid Other

## 2021-02-28 ENCOUNTER — Telehealth: Payer: Self-pay

## 2021-03-01 NOTE — Telephone Encounter (Signed)
Spoke with pt re: no show appts this week. Pt states he had conflicts with other appts. Pt was advised to call he he is not able to attend appts. Pt voiced understanding and that he was planning to attend his next scheduled appt on 3/15.

## 2021-03-04 ENCOUNTER — Ambulatory Visit: Payer: Medicaid Other

## 2021-03-05 ENCOUNTER — Ambulatory Visit: Payer: Medicaid Other | Admitting: Behavioral Health

## 2021-03-06 ENCOUNTER — Ambulatory Visit: Payer: Medicaid Other

## 2021-03-11 ENCOUNTER — Ambulatory Visit: Payer: Medicaid Other

## 2021-03-11 ENCOUNTER — Other Ambulatory Visit: Payer: Self-pay

## 2021-03-11 DIAGNOSIS — G8929 Other chronic pain: Secondary | ICD-10-CM

## 2021-03-11 DIAGNOSIS — M25512 Pain in left shoulder: Secondary | ICD-10-CM

## 2021-03-11 DIAGNOSIS — M6281 Muscle weakness (generalized): Secondary | ICD-10-CM

## 2021-03-11 DIAGNOSIS — M25612 Stiffness of left shoulder, not elsewhere classified: Secondary | ICD-10-CM | POA: Diagnosis present
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
## 2021-03-13 ENCOUNTER — Ambulatory Visit: Payer: Medicaid Other

## 2021-03-13 ENCOUNTER — Ambulatory Visit: Payer: Medicaid Other | Admitting: Internal Medicine

## 2021-03-18 ENCOUNTER — Ambulatory Visit: Payer: Medicaid Other

## 2021-03-18 ENCOUNTER — Other Ambulatory Visit: Payer: Self-pay

## 2021-03-18 DIAGNOSIS — M25612 Stiffness of left shoulder, not elsewhere classified: Secondary | ICD-10-CM

## 2021-03-18 DIAGNOSIS — M6281 Muscle weakness (generalized): Secondary | ICD-10-CM

## 2021-03-18 DIAGNOSIS — G8929 Other chronic pain: Secondary | ICD-10-CM

## 2021-03-18 DIAGNOSIS — M25512 Pain in left shoulder: Secondary | ICD-10-CM | POA: Diagnosis not present
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
## 2021-03-19 ENCOUNTER — Other Ambulatory Visit: Payer: Self-pay

## 2021-03-19 MED ORDER — CYCLOBENZAPRINE HCL 10 MG PO TABS: ORAL_TABLET | ORAL | 1 refills | Status: DC

## 2021-03-19 NOTE — Telephone Encounter (Signed)
  cyclobenzaprine (FLEXERIL) 10 MG tablet, REFILL REQUEST @  [facility] [address], [place] - [address] Phone:  [phone]  Fax:  [phone]

## 2021-03-20 ENCOUNTER — Ambulatory Visit: Payer: Medicaid Other

## 2021-03-25 ENCOUNTER — Ambulatory Visit: Payer: Medicaid Other

## 2021-03-26 ENCOUNTER — Ambulatory Visit: Payer: Medicaid Other | Admitting: Behavioral Health

## 2021-03-27 ENCOUNTER — Ambulatory Visit: Payer: Medicaid Other

## 2021-04-09 ENCOUNTER — Other Ambulatory Visit: Payer: Self-pay

## 2021-04-09 ENCOUNTER — Ambulatory Visit: Payer: Medicaid Other | Attending: Internal Medicine

## 2021-04-09 DIAGNOSIS — M25612 Stiffness of left shoulder, not elsewhere classified: Secondary | ICD-10-CM | POA: Diagnosis present

## 2021-04-09 DIAGNOSIS — M6281 Muscle weakness (generalized): Secondary | ICD-10-CM | POA: Insufficient documentation

## 2021-04-09 DIAGNOSIS — M25512 Pain in left shoulder: Secondary | ICD-10-CM | POA: Insufficient documentation

## 2021-04-09 DIAGNOSIS — G8929 Other chronic pain: Secondary | ICD-10-CM | POA: Diagnosis present
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
## 2021-04-17 ENCOUNTER — Other Ambulatory Visit: Payer: Self-pay

## 2021-04-17 ENCOUNTER — Ambulatory Visit: Payer: Medicaid Other | Admitting: Behavioral Health

## 2021-04-17 DIAGNOSIS — F331 Major depressive disorder, recurrent, moderate: Secondary | ICD-10-CM

## 2021-04-17 DIAGNOSIS — F419 Anxiety disorder, unspecified: Secondary | ICD-10-CM

## 2021-04-17 NOTE — BH Specialist Note (Signed)
Integrated Behavioral Health via Telemedicine Visit  04/17/2021 [name] [id number]  Number of Integrated Behavioral Health visits: 3/6 Session Start time: 11:40am  Session End time: 12:10pm Total time: 30  Referring Provider: Dr. Quincy Simmonds, MD Patient/Family location: Pt initially driving home in car; requests call-back in 10 min [facility] Provider location: Working remotely in private All persons participating in visit: Pt & Clinician Types of Service: Individual psychotherapy  I connected with Jeffery Goodman and/or [name] self via  Telephone or Video Enabled Telemedicine Application  (Video is Caregility application) and verified that I am speaking with the correct person using two identifiers. Discussed confidentiality: Yes   I discussed the limitations of telemedicine and the availability of in person appointments.  Discussed there is a possibility of technology failure and discussed alternative modes of communication if that failure occurs.  I discussed that engaging in this telemedicine visit, they consent to the provision of behavioral healthcare and the services will be billed under their insurance.  Patient and/or legal guardian expressed understanding and consented to Telemedicine visit: Yes   Presenting Concerns: Patient and/or family reports the following symptoms/concerns: pain issues w/L-side rotator cuff & GF Dx'd w/bone marrow cancer recently Duration of problem: pain of shoulder, knees, back -several yrs/ GF's Dx-about 10 days post Dx now-her meds delivered by [employer] to begin Tx; Severity of problem: moderate  Patient and/or Family's Strengths/Protective Factors: Social and Emotional competence, Concrete supports in place (healthy food, safe environments, etc.), Sense of purpose and Physical Health (exercise, healthy diet, medication compliance, etc.)  Goals Addressed: Patient will: 1.  Reduce symptoms of: anxiety and depression  2.  Increase  knowledge and/or ability of: coping skills and stress reduction  3.  Demonstrate ability to: Increase healthy adjustment to current life circumstances and Increase adequate support systems for patient/family  Progress towards Goals: Ongoing  Interventions: Interventions utilized:  Solution-Focused Strategies and Supportive Counseling Standardized Assessments completed: Not Needed  Patient and/or Family Response: Pt receptive to call-did not want to be in car. Pt responsive to future appt check-ins  Assessment: Patient currently experiencing inc'd concern for health of GF & his own issues w/Px pain, PT exercises & dealing w/Hx of trauma.   Patient may benefit from f/u support for efforts w/caregiving & ongoing health of self & Sig Other.  Plan: 1. Follow up with behavioral health clinician on : first avail in May for 30 min telehealth ck-in 2. Behavioral recommendations: Caregiver psychoedu 3. Referral(s): Integrated [employer] (In Clinic)  I discussed the assessment and treatment plan with the patient and/or parent/guardian. They were provided an opportunity to ask questions and all were answered. They agreed with the plan and demonstrated an understanding of the instructions.   They were advised to call back or seek an in-person evaluation if the symptoms worsen or if the condition fails to improve as anticipated.  Deneise Lever, LMFT

## 2021-05-15 ENCOUNTER — Ambulatory Visit: Payer: Medicaid Other | Admitting: Behavioral Health

## 2021-05-15 ENCOUNTER — Other Ambulatory Visit: Payer: Self-pay

## 2021-05-15 DIAGNOSIS — F419 Anxiety disorder, unspecified: Secondary | ICD-10-CM

## 2021-05-15 DIAGNOSIS — F331 Major depressive disorder, recurrent, moderate: Secondary | ICD-10-CM

## 2021-05-15 NOTE — BH Specialist Note (Signed)
Integrated Behavioral Health via Telemedicine Visit  05/15/2021 Jeffery Goodman [id number]  Number of Integrated Behavioral Health visits: 4/6 Session Start time: 1:00pm  Session End time: 1:30pm Total time: 30  Referring Provider: Dr. Quincy Simmonds, MD Patient/Family location: Pt is in private location [facility] Provider location: [facility] Office All persons participating in visit: Pt & Clinician Types of Service: Individual psychotherapy  I connected with Jeffery Goodman and/or [name] self via  Telephone or Video Enabled Telemedicine Application  (Video is Caregility application) and verified that I am speaking with the correct person using two identifiers. Discussed confidentiality: Yes   I discussed the limitations of telemedicine and the availability of in person appointments.  Discussed there is a possibility of technology failure and discussed alternative modes of communication if that failure occurs.  I discussed that engaging in this telemedicine visit, they consent to the provision of behavioral healthcare and the services will be billed under their insurance.  Patient and/or legal guardian expressed understanding and consented to Telemedicine visit: Yes   Presenting Concerns: Patient and/or family reports the following symptoms/concerns: elevated anxiety due to stressors from helping GF Duration of problem: acutely about a month; Severity of problem: moderate  Patient and/or Family's Strengths/Protective Factors: Concrete supports in place (healthy food, safe environments, etc.) and Sense of purpose  Goals Addressed: Patient will: 1.  Reduce symptoms of: anxiety, depression and stress  2.  Increase knowledge and/or ability of: coping skills and stress reduction  3.  Demonstrate ability to: Increase healthy adjustment to current life circumstances  Progress towards Goals: Ongoing  Interventions: Interventions utilized:  Solution-Focused Strategies and Supportive  Counseling Standardized Assessments completed: screeners prn  Patient and/or Family Response: Pt receptive to call & requests future appt  Assessment: Patient currently experiencing elevated anxiety due to GF's health status changes & her related beh changes due to Tx. Pt uncertain about how to fully support GF in her illness trajectory.  Patient may benefit from cont'd check-ins for support.  Plan: 1. Follow up with behavioral health clinician on : 3 wks or first avail in June for 30 min ck-ins 2. Behavioral recommendations: cont to support GF & keep self busy to distract yourself 3. Referral(s): Integrated [employer] (In Clinic)  I discussed the assessment and treatment plan with the patient and/or parent/guardian. They were provided an opportunity to ask questions and all were answered. They agreed with the plan and demonstrated an understanding of the instructions.   They were advised to call back or seek an in-person evaluation if the symptoms worsen or if the condition fails to improve as anticipated.  Deneise Lever, LMFT

## 2021-05-28 DIAGNOSIS — H04123 Dry eye syndrome of bilateral lacrimal glands: Secondary | ICD-10-CM | POA: Diagnosis not present

## 2021-05-28 DIAGNOSIS — H2513 Age-related nuclear cataract, bilateral: Secondary | ICD-10-CM | POA: Diagnosis not present

## 2021-05-28 DIAGNOSIS — T1502XA Foreign body in cornea, left eye, initial encounter: Secondary | ICD-10-CM | POA: Diagnosis not present

## 2021-05-28 DIAGNOSIS — H1045 Other chronic allergic conjunctivitis: Secondary | ICD-10-CM | POA: Diagnosis not present

## 2021-05-28 DIAGNOSIS — H43393 Other vitreous opacities, bilateral: Secondary | ICD-10-CM | POA: Diagnosis not present

## 2021-05-28 DIAGNOSIS — H179 Unspecified corneal scar and opacity: Secondary | ICD-10-CM | POA: Diagnosis not present

## 2021-06-02 ENCOUNTER — Ambulatory Visit: Payer: Medicaid Other | Admitting: Behavioral Health

## 2021-06-02 DIAGNOSIS — F419 Anxiety disorder, unspecified: Secondary | ICD-10-CM

## 2021-06-02 DIAGNOSIS — F331 Major depressive disorder, recurrent, moderate: Secondary | ICD-10-CM

## 2021-06-02 NOTE — BH Specialist Note (Signed)
Integrated Behavioral Health via Telemedicine Visit  06/02/2021 Major Santerre [id number]  Number of Integrated Behavioral Health visits: 5/6 Session Start time: 1:00pm  Session End time: 1:30pm Total time: 30  Referring Provider: Dr. Quincy Simmonds, MD Patient/Family location: Pt @ home in private [facility] Provider location: [facility] Office All persons participating in visit: Pt & Clinician Types of Service: Individual psychotherapy  I connected with Jeffery Goodman and/or Jeffery Goodman  self  via  Telephone or Video Enabled Telemedicine Application  (Video is Caregility application) and verified that I am speaking with the correct person using two identifiers. Discussed confidentiality: Yes   I discussed the limitations of telemedicine and the availability of in person appointments.  Discussed there is a possibility of technology failure and discussed alternative modes of communication if that failure occurs.  I discussed that engaging in this telemedicine visit, they consent to the provision of behavioral healthcare and the services will be billed under their insurance.  Patient and/or legal guardian expressed understanding and consented to Telemedicine visit: Yes   Presenting Concerns: Patient and/or family reports the following symptoms/concerns: elevated anx/dep Duration of problem: weeks to months; Severity of problem: mild  Patient and/or Family's Strengths/Protective Factors: Concrete supports in place (healthy food, safe environments, etc.) and Physical Health (exercise, healthy diet, medication compliance, etc.)  Goals Addressed: Patient will:  Reduce symptoms of: anxiety and depression   Increase knowledge and/or ability of: coping skills   Demonstrate ability to: Increase healthy adjustment to current life circumstances and Increase motivation to adhere to plan of care  Progress towards Goals: Ongoing  Interventions: Interventions utilized:  Mindfulness or Teacher, early years/pre, CBT Cognitive Behavioral Therapy, and Supportive Counseling Standardized Assessments completed:  screeners prn  Patient and/or Family Response: Pt receptive to call & requests future appt  Assessment: Patient currently experiencing elevated anxiety over the course of the World.   Patient may benefit from addt'l resources for distraction.  Plan: Follow up with behavioral health clinician on : 2-3 wks on telehealth for 30 min Behavioral recommendations: Try calm.com, try smoothies again-you & your Son know how to make these!, get yourself busy on outdoor projects in nature, try using the 'Happy Place' visualization Referral(s): Integrated [employer] (In Clinic)  I discussed the assessment and treatment plan with the patient and/or parent/guardian. They were provided an opportunity to ask questions and all were answered. They agreed with the plan and demonstrated an understanding of the instructions.   They were advised to call back or seek an in-person evaluation if the symptoms worsen or if the condition fails to improve as anticipated.  Deneise Lever, LMFT

## 2021-06-09 ENCOUNTER — Other Ambulatory Visit: Payer: Self-pay | Admitting: Internal Medicine

## 2021-06-26 ENCOUNTER — Other Ambulatory Visit: Payer: Self-pay | Admitting: *Deleted

## 2021-06-26 ENCOUNTER — Other Ambulatory Visit: Payer: Self-pay | Admitting: Internal Medicine

## 2021-06-26 ENCOUNTER — Other Ambulatory Visit: Payer: Self-pay

## 2021-06-26 ENCOUNTER — Ambulatory Visit: Payer: Medicaid Other | Admitting: Behavioral Health

## 2021-06-26 DIAGNOSIS — F331 Major depressive disorder, recurrent, moderate: Secondary | ICD-10-CM

## 2021-06-26 DIAGNOSIS — F419 Anxiety disorder, unspecified: Secondary | ICD-10-CM

## 2021-06-26 NOTE — BH Specialist Note (Signed)
Integrated Behavioral Health via Telemedicine Visit  06/26/2021 Jeffery Goodman [id number]  Number of Integrated Behavioral Health visits: 6/6 Session Start time: 2:00pm  Session End time: 2:30pm Total time: 30  Referring Provider: Dr. 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 Discussed confidentiality: No   I discussed the limitations of telemedicine and the availability of in person appointments.  Discussed there is a possibility of technology failure and discussed alternative modes of communication if that failure occurs.  I discussed that engaging in this telemedicine visit, they consent to the provision of behavioral healthcare and the services will be billed under their insurance.  Patient and/or legal guardian expressed understanding and consented to Telemedicine visit: No   Presenting Concerns: Patient and/or family reports the following symptoms/concerns: knee & shoulder hurts again Duration of problem: wks to mos; Severity of problem: mild  Patient and/or Family's Strengths/Protective Factors: Concrete supports in place (healthy food, safe environments, etc.) and Sense of purpose  Goals Addressed: Patient will:  Reduce symptoms of: anxiety and depression   Increase knowledge and/or ability of: coping skills and healthy habits   Demonstrate ability to: Increase healthy adjustment to current life circumstances  Progress towards Goals: Ongoing  Interventions: Interventions utilized:  Solution-Focused Strategies and Supportive Counseling Standardized Assessments completed:  screeners  prn  Patient and/or Family Response: Pt receptive to call today & requests future visit  Assessment: Patient currently experiencing elevated anx/dep due to pain w/knee & shoulder.   Patient may benefit from cont'd ck-ins for support & encouragement.  Plan: Follow up with behavioral health clinician on : one month for 30 min ck-in Behavioral recommendations: do more for self w/care practices Referral(s): Integrated [employer] (In Clinic)  I discussed the assessment and treatment plan with the patient and/or parent/guardian. They were provided an opportunity to ask questions and all were answered. They agreed with the plan and demonstrated an understanding of the instructions.   They were advised to call back or seek an in-person evaluation if the symptoms worsen or if the condition fails to improve as anticipated.  Jeffery Lever, LMFT

## 2021-06-26 NOTE — Patient Instructions (Signed)
Visit Information  Jeffery Goodman was given information about Medicaid Managed Care team care coordination services as a part of their [facility] Community Plan Medicaid benefit. Jeffery Goodman verbally consented to engagement with the [facility] Managed Care team.   For questions related to your [facility], please call: [phone] or visit the homepage here: [email or web address]  If you would like to schedule transportation through your [facility], please call the following number at least 2 days in advance of your appointment: [phone].   Call the Behavioral Health Crisis Line at [phone], at any time, 24 hours a day, 7 days a week. If you are in danger or need immediate medical attention call 911.  If you would like help to quit smoking, call 1-800-QUIT-NOW (915-342-9959) OR Espaol: 1-855-Djelo-Ya (1-660-630-1601) o para ms informacin haga clic aqu or Text READY to 093-235 to register via text  Jeffery Goodman - following are the goals we discussed in your visit today:   Goals Addressed             This Visit's Progress    Make and Keep All Appointments       Timeframe:  Long-Range Goal Priority:  High Start Date:    06/26/21                         Expected End Date:    09/26/21                   Follow Up Date 07/29/21    - call to schedule follow up appointment with PCP to discuss BP, lab results and continued shoulder pain - call for prescription refill 2-3 days before you run out - discuss with PCP ordering a BP cuff for you to monitor BP at home - call to cancel if needed - keep a calendar with prescription refill dates - keep a calendar with appointment dates    Why is this important?   Part of staying healthy is seeing the doctor for follow-up care.  If you forget your appointments, there are some things you can do to stay on track.              Please see education materials related to HTN and managing pain provided as print materials.   The patient verbalized understanding of instructions provided today and agreed to receive a mailed copy of patient instruction and/or educational materials.  Telephone follow up appointment with Managed Medicaid care management team member scheduled for:07/29/21 @ 9am  Jeffery Emms RN, BSN Jeffery Goodman  [facility] RN Care Coordinator   Following is a copy of your plan of care:  Patient Care Plan: General Plan of Care (Adult)     Problem Identified: Health Promotion or Disease Self-Management (General Plan of Care)      Long-Range Goal: Self-Management Plan Developed   Start Date: 06/26/2021  Expected End Date: 09/26/2021  This Visit's Progress: On track  Priority: High  Note:   Current Barriers:  Ineffective Self Health Maintenance Unable to independently manage health care related to shoulder pain and hypertension Does not adhere to prescribed medication regimen Does not maintain contact with provider office Does not contact provider office for questions/concerns Currently UNABLE TO independently self manage needs related to chronic health conditions.  Knowledge Deficits related to short term plan for care coordination needs and long term plans for chronic disease management needs Nurse Case Manager Clinical Goal(s):  patient will work with care management team to address care coordination and chronic disease management needs related to Care Coordination   Interventions:  Evaluation of current treatment plan related to shoulder pain and HTN and patient's adherence to plan as established by provider. Advised patient to schedule a follow up visit with PCP Provided education to patient re: HTN and managing pain Reviewed medications with patient and discussed needing propranolol, encouraged patient to call pharmacy for refill Discussed plans with patient for ongoing care  management follow up and provided patient with direct contact information for care management team Pharmacy referral for medication management Self Care Activities:  Patient will self administer medications as prescribed Patient will attend all scheduled provider appointments Patient will call pharmacy for medication refills Patient will call provider office for new concerns or questions Patient Goals: - call to schedule follow up appointment with PCP to discuss BP, lab results and continued shoulder pain - call for prescription refill 2-3 days before you run out - discuss with PCP ordering a BP cuff for you to monitor BP at home - call to cancel if needed - keep a calendar with prescription refill dates - keep a calendar with appointment dates Follow Up Plan: Telephone follow up appointment with care management team member scheduled for:07/29/21 @ 9am

## 2021-06-26 NOTE — Patient Outreach (Signed)
Medicaid Managed Care   Nurse Care Manager Note  06/26/2021 Name:  Jeffery Goodman MRN:  [id number] DOB:  1961-11-09  Jeffery Goodman is an 60 y.o. year old male who is a primary patient of Jeffery Simmonds, MD.  The [facility] Managed Care Coordination team was consulted for assistance with:    HTN Shoulder pain  Jeffery Goodman was given information about Medicaid Managed Care Coordination team services today. Jeffery Goodman agreed to services and verbal consent obtained.  Engaged with patient by telephone for initial visit in response to provider referral for case management and/or care coordination services.   Assessments/Goodman:  Review of past medical history, allergies, medications, health status, including review of consultants reports, laboratory and other test data, was performed as part of comprehensive evaluation and provision of chronic care management services.  Jeffery (Social Determinants of Health) assessments and Goodman performed: Jeffery Goodman    Flowsheet Row Most Recent Value  Jeffery Goodman   Food Insecurity Goodman Intervention Not Indicated  Housing Goodman Intervention Not Indicated, Other (Comment)  [Patient concerned that his Father will kick him out of the family home.]  Transportation Goodman Intervention Not Indicated       Care Plan  No Known Allergies  Medications Reviewed Today     Reviewed by Jeffery Goodman (Registered Nurse) on 06/26/21 at 1525  Med List Status: <None>   Medication Order Taking? Sig Documenting Provider Last Dose Status Informant  0.9 %  sodium chloride infusion [id number]   Pyrtle, Carie Caddy, MD  Active   cyclobenzaprine (FLEXERIL) 10 MG tablet [id number] Yes TAKE 1 TABLET BY MOUTH TWICE DAILY AS NEEDED FOR MUSCLE SPASM Christian, Rylee, MD Taking Active   DULoxetine (CYMBALTA) 30 MG capsule [id number] Yes Take 1 capsule (30 mg total) by mouth daily. Masoudi, Elhamalsadat, MD Taking Active    propranolol ER (INDERAL LA) 60 MG 24 hr capsule [id number]  Take 1 capsule (60 mg total) by mouth in the morning and at bedtime. Jeffery Pretty, MD  Expired 05/18/21 2359   traZODone (DESYREL) 100 MG tablet [id number] Yes 1 1/2 tabs by mouth at bedtime Jeffery Manson, MD Taking Active   triamcinolone cream (KENALOG) 0.1 % [id number] No APPLY 1 APPLICATION TOPICALLY TWO TIMES A DAY  Patient not taking: Reported on 06/26/2021   Jeffery Manson, MD Not Taking Active             Patient Active Problem List   Diagnosis Date Noted   Erythema of skin 01/29/2021   Left shoulder pain 01/16/2021   Blurred vision, left eye 01/16/2021   Anxiety 01/16/2021   Essential tremor    Family dysfunction    Acute pain of right knee 01/15/2020   Acute right-sided thoracic back pain 01/15/2020   Ulnar neuropathy of both upper extremities 01/15/2020   Bilateral carpal tunnel syndrome 01/15/2020   Dyshidrotic eczema 01/15/2020   Depression 01/27/2019   Insomnia 01/27/2019   Vertigo     Conditions to be addressed/monitored per PCP order:  HTN and shoulder pain  Care Plan : General Plan of Care (Adult)  Updates made by Jeffery Goodman since 06/26/2021 12:00 AM     Problem: Health Promotion or Disease Self-Management (General Plan of Care)      Long-Range Goal: Self-Management Plan Developed   Start Date: 06/26/2021  Expected End Date: 09/26/2021  This Visit's Progress: On track  Priority: High  Note:   Current Barriers:  Ineffective Self Health Maintenance Unable to independently manage health care related  to shoulder pain and hypertension Does not adhere to prescribed medication regimen Does not maintain contact with provider office Does not contact provider office for questions/concerns Currently UNABLE TO independently self manage needs related to chronic health conditions.  Knowledge Deficits related to short term plan for care coordination needs and long term plans for  chronic disease management needs Nurse Case Manager Clinical Goal(s):  patient will work with care management team to address care coordination and chronic disease management needs related to Care Coordination   Goodman:  Evaluation of current treatment plan related to shoulder pain and HTN and patient's adherence to plan as established by provider. Advised patient to schedule a follow up visit with PCP Provided education to patient re: HTN and managing pain Reviewed medications with patient and discussed needing propranolol, encouraged patient to call pharmacy for refill Discussed plans with patient for ongoing care management follow up and provided patient with direct contact information for care management team Pharmacy referral for medication management Self Care Activities:  Patient will self administer medications as prescribed Patient will attend all scheduled provider appointments Patient will call pharmacy for medication refills Patient will call provider office for new concerns or questions Patient Goals: - call to schedule follow up appointment with PCP to discuss BP, lab results and continued shoulder pain - call for prescription refill 2-3 days before you run out - discuss with PCP ordering a BP cuff for you to monitor BP at home - call to cancel if needed - keep a calendar with prescription refill dates - keep a calendar with appointment dates Follow Up Plan: Telephone follow up appointment with care management team member scheduled for:07/29/21 @ 9am      Follow Up:  Patient agrees to Care Plan and Follow-up.  Plan: The Managed Medicaid care management team will reach out to the patient again over the next 30 days.  Date/time of next scheduled Goodman care management/care coordination outreach:  07/29/21 @ 9am  Jeffery Emms Goodman, BSN [place]  [facility]

## 2021-06-27 NOTE — Telephone Encounter (Signed)
Will refill for the next few weeks, but can we please schedule an appointment to be seen by his PCP for management of his chronic pain. Thank you

## 2021-07-21 ENCOUNTER — Other Ambulatory Visit: Payer: Self-pay | Admitting: Student

## 2021-07-24 ENCOUNTER — Ambulatory Visit: Payer: Medicaid Other | Admitting: Behavioral Health

## 2021-07-24 DIAGNOSIS — F331 Major depressive disorder, recurrent, moderate: Secondary | ICD-10-CM

## 2021-07-24 DIAGNOSIS — F419 Anxiety disorder, unspecified: Secondary | ICD-10-CM

## 2021-07-24 NOTE — BH Specialist Note (Signed)
Integrated Behavioral Health via Telemedicine Visit  07/24/2021 Jeffery Goodman [id number]  Number of Integrated Behavioral Health visits: 7 Session Start time: 11:00am  Session End time: 11:30am Total time: 30  Referring Provider: Dr. Quincy Simmonds, MD Patient/Family location: Pt in truck driving home from Pharmacy [facility] Provider location: [facility] Office All persons participating in visit: Pt & Clinician Types of Service: Individual psychotherapy  I connected with [name] and/or [name]  self  via  Telephone or Video Enabled Telemedicine Application  (Video is Caregility application) and verified that I am speaking with the correct person using two identifiers. Discussed confidentiality:  7th visit  I discussed the limitations of telemedicine and the availability of in person appointments.  Discussed there is a possibility of technology failure and discussed alternative modes of communication if that failure occurs.  I discussed that engaging in this telemedicine visit, they consent to the provision of behavioral healthcare and the services will be billed under their insurance.  Patient and/or legal guardian expressed understanding and consented to Telemedicine visit:  7th visit  Presenting Concerns: Patient and/or family reports the following symptoms/concerns: elevated anxiety due to Dentist visit on Friday to pull 4 teeth in the front; Pt will need a partial as a result Duration of problem: wks; Severity of problem: mild  Patient and/or Family's Strengths/Protective Factors: Concrete supports in place (healthy food, safe environments, etc.)  Goals Addressed: Patient will:  Reduce symptoms of: anxiety and depression   Increase knowledge and/or ability of: coping skills and stress reduction   Demonstrate ability to: Increase healthy adjustment to current life circumstances  Progress towards Goals: Ongoing  Interventions: Interventions utilized:  Supportive  Counseling Standardized Assessments completed:  screeners prn  Patient and/or Family Response: Pt receptive to call today & requests a f/u call  Assessment: Patient currently experiencing elevated anxiety due to upcoming Dental procedure.   Patient may benefit from tools to cope during Dental visit.  Plan: Follow up with behavioral health clinician on : 2-3 wks on telehealth for 30 min Behavioral recommendations: use music to cope on Friday Referral(s): Integrated [employer] (In Clinic)  I discussed the assessment and treatment plan with the patient and/or parent/guardian. They were provided an opportunity to ask questions and all were answered. They agreed with the plan and demonstrated an understanding of the instructions.   They were advised to call back or seek an in-person evaluation if the symptoms worsen or if the condition fails to improve as anticipated.  Deneise Lever, LMFT

## 2021-07-29 ENCOUNTER — Ambulatory Visit: Payer: Medicaid Other

## 2021-08-07 ENCOUNTER — Other Ambulatory Visit: Payer: Self-pay | Admitting: Internal Medicine

## 2021-08-07 ENCOUNTER — Other Ambulatory Visit: Payer: Self-pay | Admitting: Student

## 2021-08-07 DIAGNOSIS — F419 Anxiety disorder, unspecified: Secondary | ICD-10-CM

## 2021-08-07 NOTE — Telephone Encounter (Signed)
Last appt 01/29/21  Next appt 08/19/21  Is rx long term? Pt has been getting 2wk supply at a time. Please address if supply is appropriate at 08/30 visit

## 2021-08-11 ENCOUNTER — Other Ambulatory Visit: Payer: Self-pay

## 2021-08-11 ENCOUNTER — Other Ambulatory Visit: Payer: Self-pay | Admitting: *Deleted

## 2021-08-11 NOTE — Patient Instructions (Signed)
Visit Information  Jeffery Goodman was given information about Medicaid Managed Care team care coordination services as a part of their [facility] Community Plan Medicaid benefit. Jeffery Goodman verbally consented to engagement with the [facility] Managed Care team.   If you are experiencing a medical emergency, please call 911 or report to your local emergency department or urgent care.   If you have a non-emergency medical problem during routine business hours, please contact your provider's office and ask to speak with a nurse.   For questions related to your [facility], please call: [phone] or visit the homepage here: [email or web address]  If you would like to schedule transportation through your [facility], please call the following number at least 2 days in advance of your appointment: [phone].   Call the Behavioral Health Crisis Line at [phone], at any time, 24 hours a day, 7 days a week. If you are in danger or need immediate medical attention call 911.  If you would like help to quit smoking, call 1-800-QUIT-NOW (858 474 9384) OR Espaol: 1-855-Djelo-Ya (5-537-482-7078) o para ms informacin haga clic aqu or Text READY to 675-449 to register via text  Mr. Jeffery Goodman - following are the goals we discussed in your visit today:   Goals Addressed             This Visit's Progress    Make and Keep All Appointments       Timeframe:  Long-Range Goal Priority:  High Start Date:    06/26/21                         Expected End Date:    09/26/21                   Follow Up Date 09/11/21    - call to schedule follow up appointment with PCP to discuss BP, lab results and continued shoulder pain-appointment 8/30 - increase exercise to 30 minutes a day for 5 days a week - make a list of concerns or questions to discuss with PCP - call for prescription refill 2-3 days  before you run out - discuss with PCP ordering a BP cuff for you to monitor BP at home - call to cancel if needed - keep a calendar with prescription refill dates - keep a calendar with appointment dates    Why is this important?   Part of staying healthy is seeing the doctor for follow-up care.  If you forget your appointments, there are some things you can do to stay on track.            Please see education materials related to joint pain provided by MyChart link.  Patient will complete MyChart sign up and view Care Plan and education  Telephone follow up appointment with Managed Medicaid care management team member scheduled for:09/11/21 @ 10:30am  Jeffery Emms RN, BSN [place]  [facility] RN Care Coordinator   Following is a copy of your plan of care:  Patient Care Plan: General Plan of Care (Adult)     Problem Identified: Health Promotion or Disease Self-Management (General Plan of Care)      Long-Range Goal: Self-Management Plan Developed   Start Date: 06/26/2021  Expected End Date: 09/26/2021  Recent Progress: On track  Priority: High  Note:   Current Barriers:  Ineffective Self Health Maintenance Unable to independently manage health care related to shoulder pain and hypertension  Does not adhere to prescribed medication regimen Does not maintain contact with provider office Does not contact provider office for questions/concerns Currently UNABLE TO independently self manage needs related to chronic health conditions.  Knowledge Deficits related to short term plan for care coordination needs and long term plans for chronic disease management needs Nurse Case Manager Clinical Goal(s):  patient will work with care management team to address care coordination and chronic disease management needs related to Care Coordination   Interventions:  Evaluation of current treatment plan related to shoulder pain and HTN and patient's adherence to plan as  established by provider. Advised patient to attend scheduled follow up with PCP, make a list of questions or concerns to discuss with PCP Provided education to patient re: HTN and managing pain Reviewed medications with patient, discussed Cymbalta and its benefits Discussed plans with patient for ongoing care management follow up and provided patient with direct contact information for care management team Provided therapeutic listening Encouraged patient to increase exercise to 30 min a day, 5 days a week. Discussed ways to achieve this goal Discussed MyChart and sent a link for patient to join Self Care Activities:  Patient will self administer medications as prescribed Patient will attend all scheduled provider appointments Patient will call pharmacy for medication refills Patient will call provider office for new concerns or questions Patient Goals: - call to schedule follow up appointment with PCP to discuss BP, lab results and continued shoulder pain-appointment 8/30 - make a list of concerns or questions to discuss with PCP - increase exercise to 30 minutes a day for 5 days a week - call for prescription refill 2-3 days before you run out - discuss with PCP ordering a BP cuff for you to monitor BP at home - call to cancel if needed - keep a calendar with prescription refill dates - keep a calendar with appointment dates Follow Up Plan: Telephone follow up appointment with care management team member scheduled for:09/11/21 @ 10:30am

## 2021-08-11 NOTE — Patient Outreach (Signed)
Medicaid Managed Care   Nurse Care Manager Note  08/11/2021 Name:  Jeffery Goodman MRN:  [id number] DOB:  03-25-1961  Jeffery Goodman is an 60 y.o. year old male who is a primary patient of Jeffery Goodman.  The [facility] Managed Care Coordination team was consulted for assistance with:    HTN pain  Jeffery Goodman was given information about Medicaid Managed Care Coordination team services today. Jeffery Goodman Patient agreed to services and verbal consent obtained.  Engaged with patient by telephone for follow up visit in response to provider referral for case management and/or care coordination services.   Assessments/Interventions:  Review of past medical history, allergies, medications, health status, including review of consultants reports, laboratory and other test data, was performed as part of comprehensive evaluation and provision of chronic care management services.  SDOH (Social Determinants of Health) assessments and interventions performed:   Care Plan  No Known Allergies  Medications Reviewed Today     Reviewed by Jeffery Goodman (Registered Nurse) on 08/11/21 at 1359  Med List Status: <None>   Medication Order Taking? Sig Documenting Provider Last Dose Status Informant  0.9 %  sodium chloride infusion [id number]   Jeffery Goodman  Active   cyclobenzaprine (FLEXERIL) 10 MG tablet [id number] Yes TAKE 1 TABLET BY MOUTH TWICE DAILY AS NEEDED FOR MUSCLE SPASM Jeffery Goodman Taking Active   DULoxetine (CYMBALTA) 30 MG capsule [id number] Yes Take 1 capsule by mouth once daily Jeffery Goodman Taking Active   propranolol ER (INDERAL LA) 60 MG 24 hr capsule [id number] Yes Take 1 capsule (60 mg total) by mouth in the morning and at bedtime. Jeffery Goodman Taking Active   traZODone (DESYREL) 100 MG tablet [id number] Yes 1 1/2 tabs by mouth at bedtime Jeffery Goodman Taking Active   triamcinolone cream (KENALOG) 0.1 % [id number] No APPLY 1 APPLICATION  TOPICALLY TWO TIMES A DAY  Patient not taking: No sig reported   Jeffery Goodman Not Taking Active             Patient Active Problem List   Diagnosis Date Noted   Erythema of skin 01/29/2021   Left shoulder pain 01/16/2021   Blurred vision, left eye 01/16/2021   Anxiety 01/16/2021   Essential tremor    Family dysfunction    Acute pain of right knee 01/15/2020   Acute right-sided thoracic back pain 01/15/2020   Ulnar neuropathy of both upper extremities 01/15/2020   Bilateral carpal tunnel syndrome 01/15/2020   Dyshidrotic eczema 01/15/2020   Depression 01/27/2019   Insomnia 01/27/2019   Vertigo     Conditions to be addressed/monitored per PCP order:  HTN and pain  Care Plan : General Plan of Care (Adult)  Updates made by Jeffery Goodman since 08/11/2021 12:00 AM     Problem: Health Promotion or Disease Self-Management (General Plan of Care)      Long-Range Goal: Self-Management Plan Developed   Start Date: 06/26/2021  Expected End Date: 09/26/2021  Recent Progress: On track  Priority: High  Note:   Current Barriers:  Ineffective Self Health Maintenance Unable to independently manage health care related to shoulder pain and hypertension Does not adhere to prescribed medication regimen Does not maintain contact with provider office Does not contact provider office for questions/concerns Currently UNABLE TO independently self manage needs related to chronic health conditions.  Knowledge Deficits related to short term plan for care coordination needs and long term plans for chronic disease management  needs Nurse Case Manager Clinical Goal(s):  patient will work with care management team to address care coordination and chronic disease management needs related to Care Coordination   Interventions:  Evaluation of current treatment plan related to shoulder pain and HTN and patient's adherence to plan as established by provider. Advised patient to attend  scheduled follow up with PCP, make a list of questions or concerns to discuss with PCP Provided education to patient re: HTN and managing pain Reviewed medications with patient, discussed Cymbalta and its benefits Discussed plans with patient for ongoing care management follow up and provided patient with direct contact information for care management team Provided therapeutic listening Encouraged patient to increase exercise to 30 min a day, 5 days a week. Discussed ways to achieve this goal Discussed MyChart and sent a link for patient to join Self Care Activities:  Patient will self administer medications as prescribed Patient will attend all scheduled provider appointments Patient will call pharmacy for medication refills Patient will call provider office for new concerns or questions Patient Goals: - call to schedule follow up appointment with PCP to discuss BP, lab results and continued shoulder pain-appointment 8/30 - make a list of concerns or questions to discuss with PCP - increase exercise to 30 minutes a day for 5 days a week - call for prescription refill 2-3 days before you run out - discuss with PCP ordering a BP cuff for you to monitor BP at home - call to cancel if needed - keep a calendar with prescription refill dates - keep a calendar with appointment dates Follow Up Plan: Telephone follow up appointment with care management team member scheduled for:09/11/21 @ 10:30am      Follow Up:  Patient agrees to Care Plan and Follow-up.  Plan: The Managed Medicaid care management team will reach out to the patient again over the next 30 days.  Date/time of next scheduled Goodman care management/care coordination outreach:  09/11/21 @ 10:30am  Jeffery Emms Goodman, BSN [place]  [facility] Goodman Care Coordinator

## 2021-08-14 ENCOUNTER — Ambulatory Visit: Payer: Medicaid Other | Admitting: Behavioral Health

## 2021-08-14 DIAGNOSIS — F331 Major depressive disorder, recurrent, moderate: Secondary | ICD-10-CM

## 2021-08-14 DIAGNOSIS — F419 Anxiety disorder, unspecified: Secondary | ICD-10-CM

## 2021-08-14 NOTE — BH Specialist Note (Signed)
Integrated Behavioral Health via Telemedicine Visit  08/14/2021 Jeffery Goodman [id number]  Number of Integrated Behavioral Health visits: 8 Session Start time: 1:00pm  Session End time: 1:30pm Total time: 30  Referring Provider: Dr. Quincy Simmonds, MD Patient/Family location: Pt home in private [facility] Provider location: [facility] Office All persons participating in visit: Pt & Clinician Types of Service: Individual psychotherapy  I connected with Jeffery Goodman and/or Jeffery Goodman  self  via  Telephone or Video Enabled Telemedicine Application  (Video is Caregility application) and verified that I am speaking with the correct person using two identifiers. Discussed confidentiality:  8th visit  I discussed the limitations of telemedicine and the availability of in person appointments.  Discussed there is a possibility of technology failure and discussed alternative modes of communication if that failure occurs.  I discussed that engaging in this telemedicine visit, they consent to the provision of behavioral healthcare and the services will be billed under their insurance.  Patient and/or legal guardian expressed understanding and consented to Telemedicine visit:  8th visit  Presenting Concerns: Patient and/or family reports the following symptoms/concerns: elevated concerns for StepMother who is very sick Duration of problem: months now; Severity of problem: moderate  Patient and/or Family's Strengths/Protective Factors: Social connections, Concrete supports in place (healthy food, safe environments, etc.), and Sense of purpose  Goals Addressed: Patient will:  Reduce symptoms of: anxiety and depression   Increase knowledge and/or ability of: coping skills and stress reduction   Demonstrate ability to: Increase healthy adjustment to current life circumstances and Begin healthy grieving over loss  Progress towards Goals: Ongoing  Interventions: Interventions utilized:  Supportive  Counseling Standardized Assessments completed:  screeners prn  Patient and/or Family Response: Pt receptive to call today  Assessment: Patient currently experiencing residual pain from teeth being pulled. Pending trip to see StepMother up [place]. Pt will be w/Family up [place].  Patient may benefit from cont'd ck-in telehealth for 30 min in one month.  Plan: Follow up with behavioral health clinician on : one month for 30 min telehealth ck-in session Behavioral recommendations: None @ this time Referral(s): Integrated [employer] (In Clinic)  I discussed the assessment and treatment plan with the patient and/or parent/guardian. They were provided an opportunity to ask questions and all were answered. They agreed with the plan and demonstrated an understanding of the instructions.   They were advised to call back or seek an in-person evaluation if the symptoms worsen or if the condition fails to improve as anticipated.  Deneise Lever, LMFT

## 2021-08-19 ENCOUNTER — Ambulatory Visit (INDEPENDENT_AMBULATORY_CARE_PROVIDER_SITE_OTHER): Payer: Medicaid Other | Admitting: Student

## 2021-08-19 ENCOUNTER — Encounter: Payer: Self-pay | Admitting: Student

## 2021-08-19 ENCOUNTER — Other Ambulatory Visit: Payer: Self-pay

## 2021-08-19 VITALS — BP 121/75 | HR 58 | Temp 98.4°F | Ht 69.0 in | Wt 182.3 lb

## 2021-08-19 DIAGNOSIS — F419 Anxiety disorder, unspecified: Secondary | ICD-10-CM | POA: Diagnosis not present

## 2021-08-19 DIAGNOSIS — Z0000 Encounter for general adult medical examination without abnormal findings: Secondary | ICD-10-CM | POA: Insufficient documentation

## 2021-08-19 DIAGNOSIS — M25561 Pain in right knee: Secondary | ICD-10-CM | POA: Diagnosis not present

## 2021-08-19 DIAGNOSIS — G250 Essential tremor: Secondary | ICD-10-CM

## 2021-08-19 MED ORDER — DULOXETINE HCL 60 MG PO CPEP: 60.0000 mg | ORAL_CAPSULE | Freq: Every day | ORAL | 3 refills | Status: DC
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
## 2021-08-19 NOTE — Patient Instructions (Addendum)
It was a pleasure seeing you in clinic. Today we discussed:   Right knee pain: I have ordered xrays for your knee to see if arthritis is causing the pain and how severe it is.Continue with heat/ice pain medication to help with this. Please stop taking the cyclobenzaprine   Anxiety: Please increase your duloxetine 60 mg daily in addition to session with Dr. Monna Fam and follow up in 4 weeks to reevaluate you anxiety    If you have any questions or concerns, please call our clinic at [phone] between 9am-5pm and after hours call [phone] and ask for the internal medicine resident on call. If you feel you are having a medical emergency please call 911.   Thank you, we look forward to helping you remain healthy!

## 2021-08-19 NOTE — Assessment & Plan Note (Signed)
Continues to report significant anxiety with GAD-7: 21. He has been seeing Dr. Monna Fam monthly. States he continues to feel very anxious about worsening health of his mother in addition to anxiety relating his estranged father. Has been taking Duloxetine since February and has not noticed much change. Discussed increasing dose of his medication which he is agreeable to.  - Increase Duloxetine 60 mg daily - Follow up with IBH monthly - Continue trazodone as needed for sleep

## 2021-08-19 NOTE — Assessment & Plan Note (Addendum)
Patient with history of chronic knee pain that he attributes to arthritis. 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 Notes pain has been worse in the last few weeks. Takes aleve for pain which he feels has not been as effective for him lately. Notes crepitus with deep squats. Mild TTP to posterior lateral aspect of the knee without fullness. Tenderness with rom of the knee. No crepitus appreciated on exam. Given history and age suspect knee pain may be due to OA. Does not appear to have prior imaging of the knee. Will order knee xrays to further evaluate this.  Plan Xray Right knee Continue supportive measures Stop cyclobenzaprine as he is not having much improvement on this and is on multiple serotonergic agents  Follow up after imaging is completed

## 2021-08-20 NOTE — Progress Notes (Signed)
Internal Medicine Clinic Attending ? ?Case discussed with Dr. Liang  At the time of the visit.  We reviewed the resident?s history and exam and pertinent patient test results.  I agree with the assessment, diagnosis, and plan of care documented in the resident?s note. ? ?

## 2021-08-20 NOTE — Assessment & Plan Note (Signed)
Has been taking propanolol for the past two months without improvement in tremor. 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 Will discontinue propanolol. Patient will follow up if tremors become more bothersome.

## 2021-08-31 DIAGNOSIS — R739 Hyperglycemia, unspecified: Secondary | ICD-10-CM | POA: Insufficient documentation

## 2021-08-31 DIAGNOSIS — E782 Mixed hyperlipidemia: Secondary | ICD-10-CM | POA: Insufficient documentation

## 2021-09-04 ENCOUNTER — Other Ambulatory Visit: Payer: Self-pay | Admitting: Student

## 2021-09-04 ENCOUNTER — Ambulatory Visit (HOSPITAL_COMMUNITY)
Admission: RE | Admit: 2021-09-04 | Discharge: 2021-09-04 | Disposition: A | Discharge status: Home / Self Care | Payer: Medicaid Other | Source: Ambulatory Visit | Attending: Internal Medicine | Admitting: Internal Medicine

## 2021-09-04 ENCOUNTER — Other Ambulatory Visit: Payer: Self-pay

## 2021-09-04 DIAGNOSIS — M25561 Pain in right knee: Secondary | ICD-10-CM | POA: Insufficient documentation

## 2021-09-10 ENCOUNTER — Ambulatory Visit: Payer: Medicaid Other | Admitting: Behavioral Health

## 2021-09-11 ENCOUNTER — Other Ambulatory Visit: Payer: Self-pay | Admitting: *Deleted

## 2021-09-11 NOTE — Patient Outreach (Signed)
Care Coordination  09/11/2021  Jeffery Goodman 1961-01-06 [id number]   Medicaid Managed Care   Unsuccessful Outreach Note  09/11/2021 Name: Jeffery Goodman MRN: [id number] DOB: 15-Oct-1961  Referred by: Quincy Simmonds, MD Reason for referral : High Risk Managed Medicaid (Unsuccessful RNCM follow up outreach)   An unsuccessful telephone outreach was attempted today. The patient was referred to the case management team for assistance with care management and care coordination.   Follow Up Plan: The care management team will reach out to the patient again over the next 14 days.   [name] RN, BSN [place]  [facility]

## 2021-09-11 NOTE — Patient Instructions (Signed)
Visit Information  Mr. Robertt Glasheen  - as a part of your Medicaid benefit, you are eligible for care management and care coordination services at no cost or copay. I was unable to reach you by phone today but would be happy to help you with your health related needs. Please feel free to call me @ [phone].   A member of the Managed Medicaid care management team will reach out to you again over the next 14 days.   Jeaninne Lodico RN, BSN [place]  [facility] RN Care Coordinator   

## 2021-09-16 ENCOUNTER — Encounter: Payer: Self-pay | Admitting: Student

## 2021-09-16 ENCOUNTER — Ambulatory Visit (INDEPENDENT_AMBULATORY_CARE_PROVIDER_SITE_OTHER): Payer: Medicaid Other | Admitting: Student

## 2021-09-16 ENCOUNTER — Other Ambulatory Visit: Payer: Self-pay

## 2021-09-16 VITALS — BP 110/77 | HR 51 | Temp 98.4°F | Resp 24 | Ht 69.0 in | Wt 180.8 lb

## 2021-09-16 DIAGNOSIS — G250 Essential tremor: Secondary | ICD-10-CM

## 2021-09-16 DIAGNOSIS — M25561 Pain in right knee: Secondary | ICD-10-CM | POA: Diagnosis not present

## 2021-09-16 DIAGNOSIS — Z23 Encounter for immunization: Secondary | ICD-10-CM

## 2021-09-16 DIAGNOSIS — G8929 Other chronic pain: Secondary | ICD-10-CM | POA: Diagnosis not present

## 2021-09-16 DIAGNOSIS — Z0000 Encounter for general adult medical examination without abnormal findings: Secondary | ICD-10-CM

## 2021-09-16 DIAGNOSIS — F419 Anxiety disorder, unspecified: Secondary | ICD-10-CM | POA: Diagnosis not present

## 2021-09-16 MED ORDER — DICLOFENAC SODIUM 1 % EX GEL: 2.0000 g | Freq: Four times a day (QID) | CUTANEOUS | 1 refills | Status: DC

## 2021-09-16 NOTE — Patient Instructions (Addendum)
Pes Anserine Bursitis The pes anserine is an area on the inside of your knee, just below the joint, that is cushioned by a fluid-filled sac (bursa). Pes anserine bursitis is a condition that happens when the bursa gets swollen and irritated. The condition causes knee pain. What are the causes? This condition may be caused by: Making the same movement over and over. A direct hit (trauma) to the inside of the leg. What increases the risk? You are more likely to develop this condition if you: Are a runner. Play sports that involve a lot of running and quick side-to-side movements (cutting). Are an athlete who plays contact sports. Swim using an inward angle of the knee, such as with the breaststroke. Have tight hamstring muscles. Are a woman. Are overweight. Have flat feet. Have diabetes or osteoarthritis. What are the signs or symptoms? Symptoms of this condition include: Knee pain that gets better with rest and worse with activities like climbing stairs, walking, running, or getting in and out of a chair. Swelling. Warmth. Tenderness when pressing at the inside of the lower leg, just below the knee. How is this diagnosed? This condition may be diagnosed based on: Your symptoms. Your medical history. A physical exam. During your physical exam, your health care provider will press on the tendon attachment to see if you feel pain. Your health care provider may also check your hip and knee motion and strength. Tests to check for swelling and fluid buildup in the bursa and to look at muscles, bones, and tendons. These tests might include: X-rays. MRI. Ultrasound. How is this treated? This condition may be treated by: Resting your knee. You may be told to raise (elevate) your knee while resting. Avoiding activities that cause pain. Icing the inside of your knee. Sleeping with a pillow between your knees. This will cushion your injured knee. Taking medicine by mouth (orally) to  reduce pain and swelling or having medicine injected into your knee. Doing strengthening and stretching exercises (physical therapy). If these treatments do not work or if the condition keeps coming back, you may need to have surgery to remove the bursa. Follow these instructions at home: Managing pain, stiffness, and swelling  If directed, put ice on the injured area. Put ice in a plastic bag. Place a towel between your skin and the bag. Leave the ice on for 20 minutes, 2-3 times a day. Elevate the injured area above the level of your heart while you are sitting or lying down. Activity Return to your normal activities as told by your health care provider. Ask your health care provider what activities are safe for you. Do exercises as told by your health care provider. General instructions Take over-the-counter and prescription medicines only as told by your health care provider. Sleep with a pillow between your knees. Do not use any products that contain nicotine or tobacco, such as cigarettes, e-cigarettes, and chewing tobacco. These can delay healing. If you need help quitting, ask your health care provider. If you are overweight, work with your health care provider and a dietitian to set a weight-loss goal that is healthy and reasonable for you. Keep all follow-up visits as told by your health care provider. This is important. How is this prevented? When exercising, make sure that you: Warm up and stretch before being active. Cool down and stretch after being active. Give your body time to rest between periods of activity. Use equipment that fits you. Are safe and responsible while being active  to avoid falls. Do at least 150 minutes of moderate-intensity exercise each week, such as brisk walking or water aerobics. Maintain physical fitness, including: Strength. Flexibility. Cardiovascular fitness. Endurance. Maintain a healthy weight. Contact a health care provider if: Your  symptoms do not improve. Your symptoms get worse. Summary Pes anserine bursitis is a condition that happens when the fluid-filled sac (bursa) at the inside of your knee gets swollen and irritated. The condition causes knee pain. Treatment for pes anserine bursitis may include resting your knee, icing the inside of your knee, sleeping with a pillow between your knees, taking medicine by mouth or by injection, and doing strengthening and stretching exercises (physical therapy). Follow instructions for managing pain, stiffness, and swelling. Take over-the-counter and prescription medicines only as told by your health care provider. This information is not intended to replace advice given to you by your health care provider. Make sure you discuss any questions you have with your health care provider. Document Revised: 03/30/2019 Document Reviewed: 05/18/2018 Elsevier Patient Education  2022 Elsevier Inc.  Please pick up Voltaren gel for your knee pain
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
## 2021-09-18 NOTE — Assessment & Plan Note (Signed)
Patient presents for follow-up of right knee pain.  Reviewed x-ray results and discussed that there is no findings of osteoarthritis.  Today patient notes medial knee pain near the medial tibia.  Tenderness is reproducible on exam.  There is no joint effusion or crepitus.  He has good range of motion of the knee.  I suspect he may have a Pes anserine bursitis.  Discussed that this may improve with physical therapy.  Patient expresses that he would like to avoid physical therapy for now due to flu season and COVID.  Plan Voltaren gel as needed for pain Patient given knee exercises Patient will follow-up if knee pain is worsening or to discuss PT if he would desire in the future

## 2021-09-18 NOTE — Assessment & Plan Note (Signed)
GAD-7 improved to 7 today.  Continues to see Dr. Monna Fam.  Encouraged him to make a follow-up appointment with Dr. Monna Fam.  Continue duloxetine 60 mg daily Follow-up with Dr. Monna Fam

## 2021-09-18 NOTE — Assessment & Plan Note (Signed)
Patient reports he resumes taking his propranolol for his tremor.  Does state that it helps with his tremors.  Notable that his heart rate is in the 50s today.  Patient does not experience any symptoms related to decreased heart rate.

## 2021-09-18 NOTE — Assessment & Plan Note (Signed)
Flu shot administered today. 

## 2021-09-20 NOTE — Progress Notes (Signed)
Internal Medicine Clinic Attending ? ?Case discussed with Dr. Liang  At the time of the visit.  We reviewed the resident?s history and exam and pertinent patient test results.  I agree with the assessment, diagnosis, and plan of care documented in the resident?s note. ? ?

## 2021-09-26 ENCOUNTER — Other Ambulatory Visit: Payer: Self-pay | Admitting: *Deleted

## 2021-09-26 NOTE — Patient Instructions (Signed)
Visit Information  Mr. Jeffery Goodman  - as a part of your Medicaid benefit, you are eligible for care management and care coordination services at no cost or copay. I was unable to reach you by phone today but would be happy to help you with your health related needs. Please feel free to call me @ [phone].   A member of the Managed Medicaid care management team will reach out to you again over the next 14 days.   [name] RN, BSN [place]  [facility]

## 2021-09-26 NOTE — Patient Outreach (Signed)
Care Coordination  09/26/2021  [name] [birth year] [id number]   Medicaid Managed Care   Unsuccessful Outreach Note  09/26/2021 Name: Jeffery Goodman MRN: [id number] DOB: 1961/08/04  Referred by: Quincy Simmonds, MD Reason for referral : High Risk Managed Medicaid (Unsuccessful RNCM follow up outreach, 2nd attempt)   A second unsuccessful telephone outreach was attempted today. The patient was referred to the case management team for assistance with care management and care coordination.   Follow Up Plan: The care management team will reach out to the patient again over the next 14 days.   [name] RN, BSN [place]  [employer]

## 2021-10-02 ENCOUNTER — Ambulatory Visit: Payer: Medicaid Other | Admitting: Behavioral Health

## 2021-10-02 DIAGNOSIS — F419 Anxiety disorder, unspecified: Secondary | ICD-10-CM

## 2021-10-02 DIAGNOSIS — F331 Major depressive disorder, recurrent, moderate: Secondary | ICD-10-CM

## 2021-10-02 NOTE — BH Specialist Note (Signed)
Integrated Behavioral Health via Telemedicine Visit  10/02/2021 Jeffery Goodman [id number]  Number of Integrated Behavioral Health visits: 9 Session Start time: 1:15pm  Session End time: 1:30pm Total time: 15  Referring Provider: Dr. Quincy Simmonds, MD Patient/Family location: Pt is home in private [facility] Provider location: [facility] Office All persons participating in visit: Pt & Clinician Types of Service: Individual psychotherapy  I connected with Jeffery Goodman and/or Jeffery Goodman  self  via  Telephone or Video Enabled Telemedicine Application  (Video is Caregility application) and verified that I am speaking with the correct person using two identifiers. Discussed confidentiality:  9th visit  I discussed the limitations of telemedicine and the availability of in person appointments.  Discussed there is a possibility of technology failure and discussed alternative modes of communication if that failure occurs.  I discussed that engaging in this telemedicine visit, they consent to the provision of behavioral healthcare and the services will be billed under their insurance.  Patient and/or legal guardian expressed understanding and consented to Telemedicine visit:  9th visit  Presenting Concerns: Patient and/or family reports the following symptoms/concerns: elevated Sx of dep & sleep hygiene issues Duration of problem: weeks; Severity of problem: moderate  Patient and/or Family's Strengths/Protective Factors: Concrete supports in place (healthy food, safe environments, etc.) and Sense of purpose  Goals Addressed: Patient will:  Reduce symptoms of: anxiety, depression, and stress   Increase knowledge and/or ability of: coping skills and stress reduction   Demonstrate ability to: Increase healthy adjustment to current life circumstances & resist being so isolated for long stretches of time.  Progress towards Goals: Ongoing  Interventions: Interventions utilized:   Solution-Focused Strategies and Supportive Counseling Standardized Assessments completed:  screeners prn  Patient and/or Family Response: Pt receptive to call & presenting in dep'd mood. Pt is not sleeping well & c/o Px pain in back, knees, ankles. His work in [employer] growing up has lft him hurting.  Pt requests future appt & help w/sleep hygiene.  Assessment: Patient currently experiencing elevated dep & sleep hygiene disruption. Pt sounds apathetic today & down for Family situation.  Patient may benefit from cont'd ck-ins for mental health wellness.  Plan: Follow up with behavioral health clinician on : 2-3 wks on telehealth for 30 min Behavioral recommendations: Keep yourself busy & try to be around ppl in the world more often as you are able. Referral(s): Integrated [employer] [facility] (In Clinic)  I discussed the assessment and treatment plan with the patient and/or parent/guardian. They were provided an opportunity to ask questions and all were answered. They agreed with the plan and demonstrated an understanding of the instructions.   They were advised to call back or seek an in-person evaluation if the symptoms worsen or if the condition fails to improve as anticipated.  Jeffery Lever, LMFT

## 2021-10-14 ENCOUNTER — Other Ambulatory Visit: Payer: Self-pay | Admitting: *Deleted

## 2021-10-14 ENCOUNTER — Other Ambulatory Visit: Payer: Self-pay

## 2021-10-14 NOTE — Patient Outreach (Signed)
Medicaid Managed Care   Nurse Care Manager Note  10/14/2021 Name:  Jeffery Goodman MRN:  [id number] DOB:  Dec 24, 1960  Jeffery Goodman is an 60 y.o. year old male who is a primary patient of Jeffery Beard, MD.  The [facility] Managed Care Coordination team was consulted for assistance with:    HTN Joint pain  Mr. Jeffery Goodman was given information about Medicaid Managed Care Coordination team services today. Jeffery Goodman Patient agreed to services and verbal consent obtained.  Engaged with patient by telephone for follow up visit in response to provider referral for case management and/or care coordination services.   Assessments/Interventions:  Review of past medical history, allergies, medications, health status, including review of consultants reports, laboratory and other test data, was performed as part of comprehensive evaluation and provision of chronic care management services.  SDOH (Social Determinants of Health) assessments and interventions performed: SDOH Interventions    Flowsheet Row Most Recent Value  SDOH Interventions   Social Connections Interventions Intervention Not Indicated       Care Plan  No Known Allergies  Medications Reviewed Today     Reviewed by Jeffery Montane, RN (Registered Nurse) on 10/14/21 at 1245  Med List Status: <None>   Medication Order Taking? Sig Documenting Provider Last Dose Status Informant  0.9 %  sodium chloride infusion [id number]   Goodman, Jeffery Lines, MD  Active   diclofenac Sodium (VOLTAREN) 1 % GEL [id number] Yes Apply 2 g topically 4 (four) times daily. Apply to right knee for pain Jeffery Beard, MD Taking Active   DULoxetine (CYMBALTA) 60 MG capsule [id number] Yes Take 1 capsule (60 mg total) by mouth daily. Jeffery Beard, MD Taking Active   propranolol (INDERAL) 60 MG tablet [id number] Yes Take 60 mg by mouth in the morning and at bedtime. [provider] Taking Active   traZODone (DESYREL) 100 MG tablet [id number] Yes 1 1/2  tabs by mouth at bedtime [name], MD Taking Active             Patient Active Problem List   Diagnosis Date Noted   Mixed hyperlipidemia 08/31/2021   Hyperglycemia 08/31/2021   Healthcare maintenance 08/19/2021   Erythema of skin 01/29/2021   Left shoulder pain 01/16/2021   Blurred vision, left eye 01/16/2021   Anxiety 01/16/2021   Essential tremor    Family dysfunction    Right knee pain 01/15/2020   Acute right-sided thoracic back pain 01/15/2020   Ulnar neuropathy of both upper extremities 01/15/2020   Bilateral carpal tunnel syndrome 01/15/2020   Dyshidrotic eczema 01/15/2020   Depression 01/27/2019   Insomnia 01/27/2019   Vertigo     Conditions to be addressed/monitored per PCP order:  HTN and joint pain  Care Plan : General Plan of Care (Adult)  Updates made by Jeffery Montane, RN since 10/14/2021 12:00 AM  Completed 10/14/2021   Problem: Health Promotion or Disease Self-Management (General Plan of Care) Resolved 10/14/2021     Long-Range Goal: Self-Management Plan Developed Completed 10/14/2021  Start Date: 06/26/2021  Expected End Date: 09/26/2021  Recent Progress: On track  Priority: High  Note:   Current Barriers:  Ineffective Self Health Maintenance Unable to independently manage health care related to shoulder pain and hypertension Does not adhere to prescribed medication regimen Does not maintain contact with provider office Does not contact provider office for questions/concerns Currently UNABLE TO independently self manage needs related to chronic health conditions.  Knowledge Deficits related to short term plan for care coordination needs  and long term plans for chronic disease management needs Nurse Case Manager Clinical Goal(s):  patient will work with care management team to address care coordination and chronic disease management needs related to Care Coordination  -Met Interventions:  Evaluation of current treatment plan related to  shoulder pain and HTN and patient's adherence to plan as established by provider. Advised patient to attend scheduled follow up with PCP, make a list of questions or concerns to discuss with PCP Provided education to patient re: HTN and managing pain Reviewed medications, advised to call for refills before running out of medication Discussed plans with patient for ongoing care management follow up and provided patient with direct contact information for care management team Provided therapeutic listening Encouraged patient to increase exercise to 30 min a day, 5 days a week. Discussed ways to achieve this goal Assessed SDOH Self Care Activities:  Patient will self administer medications as prescribed Patient will attend all scheduled provider appointments Patient will call pharmacy for medication refills Patient will call provider office for new concerns or questions Patient Goals: - call to schedule follow up appointment with PCP to discuss BP, lab results and continued shoulder pain-appointment 8/30 - make a list of concerns or questions to discuss with PCP - increase exercise to 30 minutes a day for 5 days a week - call for prescription refill 2-3 days before you run out - discuss with PCP ordering a BP cuff for you to monitor BP at home - call to cancel if needed - keep a calendar with prescription refill dates - keep a calendar with appointment dates Follow Up Plan: Telephone follow up appointment with care management team member scheduled for:No follow up at this time      Follow Up:  Patient agrees to Care Plan and Follow-up.  Plan: The  Patient has been provided with contact information for the Managed Medicaid care management team and has been advised to call with any health related questions or concerns.  Date/time of next scheduled RN care management/care coordination outreach:  no follow up scheduled  Jeffery Joiner RN, [place] RN Care  Coordinator

## 2021-10-14 NOTE — Patient Instructions (Signed)
Visit Information  Mr. Sanderson was given information about Medicaid Managed Care team care coordination services as a part of their [place] Medicaid benefit. Hendrik Donath verbally consented to engagement with the [facility] Managed Care team.   If you are experiencing a medical emergency, please call 911 or report to your local emergency department or urgent care.   If you have a non-emergency medical problem during routine business hours, please contact your provider's office and ask to speak with a nurse.   For questions related to your [facility], please call: [phone] or visit the homepage here: [email or web address]  If you would like to schedule transportation through your [facility], please call the following number at least 2 days in advance of your appointment: [phone].   Call the [place] at [phone], at any time, 24 hours a day, 7 days a week. If you are in danger or need immediate medical attention call 911.  If you would like help to quit smoking, call 1-800-QUIT-NOW 807-055-5174) OR Espaol: 1-855-Djelo-Ya (2-641-583-0940) o para ms informacin haga clic aqu or Text READY to 200-400 to register via text  Mr. Brant - following are the goals we discussed in your visit today:   Goals Addressed             This Visit's Progress    COMPLETED: Make and Keep All Appointments       Timeframe:  Long-Range Goal Priority:  High Start Date:    06/26/21                         Expected End Date:    09/26/21                   Follow Up Date 09/11/21    - call to schedule follow up appointment with PCP to discuss BP, lab results and continued shoulder pain-appointment 8/30 - increase exercise to 30 minutes a day for 5 days a week - make a list of concerns or questions to discuss with PCP - call for prescription  refill 2-3 days before you run out - discuss with PCP ordering a BP cuff for you to monitor BP at home - call to cancel if needed - keep a calendar with prescription refill dates - keep a calendar with appointment dates    Why is this important?   Part of staying healthy is seeing the doctor for follow-up care.  If you forget your appointments, there are some things you can do to stay on track.            Please see education materials related to prevention, joint pain and DASH diet provided by MyChart link.  Patient has access to MyChart and can view provided education  The  Patient has been provided with contact information for the Managed Medicaid care management team and has been advised to call with any health related questions or concerns. [place], BSN [place] RN Care Coordinator   Following is a copy of your plan of care:  Patient Care Plan: General Plan of Care (Adult)  Completed 10/14/2021   Problem Identified: Health Promotion or Disease Self-Management (General Plan of Care) Resolved 10/14/2021     Long-Range Goal: Self-Management Plan Developed Completed 10/14/2021  Start Date: 06/26/2021  Expected End Date: 09/26/2021  Recent Progress: On track  Priority: High  Note:  Current Barriers:  Ineffective Self Health Maintenance Unable to independently manage health care related to shoulder pain and hypertension Does not adhere to prescribed medication regimen Does not maintain contact with provider office Does not contact provider office for questions/concerns Currently UNABLE TO independently self manage needs related to chronic health conditions.  Knowledge Deficits related to short term plan for care coordination needs and long term plans for chronic disease management needs Nurse Case Manager Clinical Goal(s):  patient will work with care management team to address care coordination and chronic disease  management needs related to Care Coordination  -Met Interventions:  Evaluation of current treatment plan related to shoulder pain and HTN and patient's adherence to plan as established by provider. Advised patient to attend scheduled follow up with PCP, make a list of questions or concerns to discuss with PCP Provided education to patient re: HTN and managing pain Reviewed medications, advised to call for refills before running out of medication Discussed plans with patient for ongoing care management follow up and provided patient with direct contact information for care management team Provided therapeutic listening Encouraged patient to increase exercise to 30 min a day, 5 days a week. Discussed ways to achieve this goal Assessed SDOH Self Care Activities:  Patient will self administer medications as prescribed Patient will attend all scheduled provider appointments Patient will call pharmacy for medication refills Patient will call provider office for new concerns or questions Patient Goals: - call to schedule follow up appointment with PCP to discuss BP, lab results and continued shoulder pain-appointment 8/30 - make a list of concerns or questions to discuss with PCP - increase exercise to 30 minutes a day for 5 days a week - call for prescription refill 2-3 days before you run out - discuss with PCP ordering a BP cuff for you to monitor BP at home - call to cancel if needed - keep a calendar with prescription refill dates - keep a calendar with appointment dates Follow Up Plan: Telephone follow up appointment with care management team member scheduled for:No follow up at this time

## 2021-10-16 ENCOUNTER — Other Ambulatory Visit: Payer: Self-pay | Admitting: Internal Medicine

## 2021-10-22 ENCOUNTER — Other Ambulatory Visit: Payer: Self-pay

## 2021-10-22 DIAGNOSIS — F419 Anxiety disorder, unspecified: Secondary | ICD-10-CM

## 2021-10-22 MED ORDER — DULOXETINE HCL 60 MG PO CPEP: 60.0000 mg | ORAL_CAPSULE | Freq: Every day | ORAL | 3 refills | Status: DC

## 2021-10-22 MED ORDER — PROPRANOLOL HCL 60 MG PO TABS: 60.0000 mg | ORAL_TABLET | Freq: Two times a day (BID) | ORAL | 1 refills | Status: DC

## 2021-10-22 NOTE — Telephone Encounter (Signed)
TC to patient to inform him Cymbalta and Propranolol RX's were filled, however, MD would like him to schedule an appt to discuss Trazodone medication.  Voicemail was obtained which stated "VM box has not been set up yet".  RN unable to leave a message. SChaplin, RN,BSN

## 2021-10-22 NOTE — Telephone Encounter (Signed)
Will refill propranolol, but would like patient to have an appointment to discuss trazodone and [place] taking over this prescription. Will deny trazodone refill for now.

## 2021-10-22 NOTE — Telephone Encounter (Signed)
propranolol (INDERAL) 60 MG tablet  DULoxetine (CYMBALTA) 60 MG capsule  traZODone (DESYREL) 100 MG tablet, REFILL REQUEST @ [facility] - [place] ([place]), [place] - [address]

## 2021-10-23 ENCOUNTER — Telehealth: Payer: Self-pay | Admitting: Behavioral Health

## 2021-10-23 ENCOUNTER — Ambulatory Visit: Payer: Medicaid Other | Admitting: Behavioral Health

## 2021-10-23 NOTE — Telephone Encounter (Signed)
Unable to lv msg for Pt as VM is not set-up.   Dr. Monna Fam

## 2021-12-29 ENCOUNTER — Encounter: Payer: Self-pay | Admitting: Internal Medicine

## 2021-12-29 ENCOUNTER — Ambulatory Visit (INDEPENDENT_AMBULATORY_CARE_PROVIDER_SITE_OTHER): Payer: Medicaid Other | Admitting: Internal Medicine

## 2021-12-29 ENCOUNTER — Other Ambulatory Visit: Payer: Self-pay

## 2021-12-29 VITALS — BP 138/90 | HR 60 | Resp 16 | Ht 68.0 in | Wt 182.5 lb

## 2021-12-29 DIAGNOSIS — G250 Essential tremor: Secondary | ICD-10-CM | POA: Diagnosis not present

## 2021-12-29 DIAGNOSIS — G8929 Other chronic pain: Secondary | ICD-10-CM

## 2021-12-29 DIAGNOSIS — M25512 Pain in left shoulder: Secondary | ICD-10-CM

## 2021-12-29 DIAGNOSIS — M654 Radial styloid tenosynovitis [de Quervain]: Secondary | ICD-10-CM | POA: Diagnosis not present

## 2021-12-29 DIAGNOSIS — M7050 Other bursitis of knee, unspecified knee: Secondary | ICD-10-CM | POA: Diagnosis not present

## 2021-12-29 DIAGNOSIS — L989 Disorder of the skin and subcutaneous tissue, unspecified: Secondary | ICD-10-CM

## 2021-12-29 MED ORDER — PREDNISONE 10 MG PO TABS: ORAL_TABLET | ORAL | 0 refills | Status: DC

## 2021-12-29 NOTE — Progress Notes (Signed)
° ° °  Subjective:    Patient ID: Jeffery Goodman, male   DOB: [birth year], 61 y.o.   MRN: [id number]   HPI  Patient states he is moving his primary care back to [facility].     Multiple joint complaints:  he had evaluations for shoulder and knee pain.  He received PT for the shoulder with no improvement.  States was told he would need shoulder replacement as some point in future when he can no longer tolerate pain.  Knee with pes anserine bursitis.  Using Aleve for the pain.  He still is not interested in PT for the latter due to COVID and influenza season.    2.  Hand Pain:  Sharp pain along 1st and 2nd metacarpal bone when holding something or gripping something.  Pain radiates up his dorsal wrist to just above distal ulna and radius.  Stops whatever he is working on whenever starts up.  Thumbs seem to bother him the most.  Has been a problem for 3-4 months.    3.  Bumps on scalp:  Has noted for 2 months.  Cannot say if enlarging.  Thought they were from wearing a hat, but have not gone away when stopped wearing hat.    Current Meds  Medication Sig   DULoxetine (CYMBALTA) 60 MG capsule Take 1 capsule (60 mg total) by mouth daily.   famotidine (PEPCID) 20 MG tablet Take 20 mg by mouth as needed for heartburn or indigestion.   propranolol (INDERAL) 60 MG tablet Take 1 tablet (60 mg total) by mouth in the morning and at bedtime.   No Known Allergies   Review of Systems    Objective:   BP 138/90 (BP Location: Left Arm, Patient Position: Sitting, Cuff Size: Normal)    Pulse 60    Resp 16    Ht 5\' 8"  (1.727 m)    Wt 182 lb 8 oz (82.8 kg)    BMI 27.75 kg/m   Physical Exam NAD 1/2 cm scabbed area at left part line, middle top of head.  Perhaps a mild elevation in scalp about 1 inch posterior to scabbed lesion.  + Finkelsteins testing bilaterally and mildly tender over extensor tendons to 2nd finger bilaterally.  Assessment & Plan    De Quervain's Tenosynovitis, bilateral:  Discussed  how prednisone can affect sleep and mood/agitation--to call if problems.  Prednisone burst at 40 mg daily for 4 days, then taper.  SPICA splints for 2 weeks worn continuously save for bathing/washing hands.  2.  Scalp lesions:  likely from scratching area.  Bacitracin ointment twice daily to the scabbed area.  Follow up in 2 months to see if healed.  No touching scalp save for application of ointment.  3.  Left shoulder pain and pes anserine bursitis:  he will let me know if needs further treatment.  4.  Essential Tremor:  discussed possibility now that he has medicaid to switch to 24 h Propranolol formulation if chooses.  He will let me know.

## 2021-12-29 NOTE — Patient Instructions (Addendum)
Prednisone  burst and taper:  Day 1-4:  4 tabs by mouth daily Day 5:  3 1/2 tabs Day 6:  3 tabs Day 7:  2 1/2 tabs Day 8:  2 tabs Day 9:  1 1/2 tabs Day 10: 1 tab Day 11:  1/2 tab Day 12:  done   Wear Spica splints all the time save when washing hands or showering for 2 weeks without stopping.

## 2022-02-23 ENCOUNTER — Ambulatory Visit (INDEPENDENT_AMBULATORY_CARE_PROVIDER_SITE_OTHER): Payer: Medicaid Other | Admitting: Internal Medicine

## 2022-02-23 ENCOUNTER — Other Ambulatory Visit: Payer: Self-pay

## 2022-02-23 ENCOUNTER — Encounter: Payer: Self-pay | Admitting: Internal Medicine

## 2022-02-23 VITALS — BP 110/68 | HR 78 | Resp 12 | Ht 68.0 in | Wt 190.5 lb

## 2022-02-23 DIAGNOSIS — L989 Disorder of the skin and subcutaneous tissue, unspecified: Secondary | ICD-10-CM

## 2022-02-23 DIAGNOSIS — M654 Radial styloid tenosynovitis [de Quervain]: Secondary | ICD-10-CM

## 2022-02-23 DIAGNOSIS — F419 Anxiety disorder, unspecified: Secondary | ICD-10-CM

## 2022-02-23 DIAGNOSIS — G250 Essential tremor: Secondary | ICD-10-CM | POA: Diagnosis not present

## 2022-02-23 MED ORDER — DULOXETINE HCL 60 MG PO CPEP: ORAL_CAPSULE | ORAL | 11 refills | Status: DC

## 2022-02-23 MED ORDER — IBUPROFEN 800 MG PO TABS: 800.0000 mg | ORAL_TABLET | Freq: Four times a day (QID) | ORAL | 0 refills | Status: DC | PRN

## 2022-02-23 MED ORDER — CYCLOBENZAPRINE HCL 10 MG PO TABS: ORAL_TABLET | ORAL | 2 refills | Status: DC

## 2022-02-23 NOTE — Progress Notes (Signed)
° ° °  Subjective:    Patient ID: Jeffery Goodman, male   DOB: 1961-06-21, 61 y.o.   MRN: [id number]   HPI   Suzette Battiest Tenosynovitis:  Did get Spica splints and used Prednisone burst and taper.  Still with issues from time to time, but not nearly as bad as was.  He is not interested in physical therapy to work on the issue more.  He does still have splints and continues to wear at nighttime.  He is finding work arounds to avoid using his thumb in a way that sets the pain off again.     2.  Scalp lesions:  Used Bacitracin ointment on scalp and went away.    3.  Essential Tremor:  stopped taking the Propranolol as his dad has seemed to back off of taking his home and no longer so anxious.  He feels this is the main reason he had the tremor.  He is also continuing on Duloxetine and will be out after another month.  4.  Muscle spasms:  He is having more muscle spasms of hands and would like the cyclobenzaprine.    5.  Neck pain:  was given ibuprofen for tooth removal and Ibuprofen 800 mg really helped the neck pain as well  Current Meds  Medication Sig   famotidine (PEPCID) 20 MG tablet Take 20 mg by mouth as needed for heartburn or indigestion.   ibuprofen (ADVIL) 800 MG tablet Take 800 mg by mouth every 6 (six) hours as needed. 1 tablet by mouth every 6 hours as needed for pain   propranolol (INDERAL) 60 MG tablet Take 1 tablet (60 mg total) by mouth in the morning and at bedtime.   No Known Allergies   Review of Systems    Objective:   BP 110/68 (BP Location: Left Arm, Patient Position: Sitting, Cuff Size: Normal)   Pulse 78   Resp 12   Ht 5\' 8"  (1.727 m)   Wt 190 lb 8 oz (86.4 kg)   BMI 28.97 kg/m   Physical Exam NAD HEENT:  PERRL, EOMI.  No lesion on scalp today. Neck:  Tender with tightness over traps bilaterally to paraspinous cervical musculature. Chest:  CTA CV:  RRR without murmur or rub MS:  Finkelsteins testing negative bilaterally. Neuro:  no tremulousness  noted today.   Assessment & Plan    Bilateral De Quervains Tenosynovitis:  much improved.  To utilize splints as needed.  2.  Tremor of hands:  doing well without beta blockade now that stress reduced.  3.  Anxiety and depression:  Cymbalta 60 mg daily refilled.  4.  Muscle spasms of hands:  Cyclobenzaprine as needed   5.  Neck muscle pain:  Ibuprofen with food as needed.  6.  Scalp lesion:  resolved

## 2022-03-25 DIAGNOSIS — R55 Syncope and collapse: Secondary | ICD-10-CM | POA: Diagnosis not present

## 2022-03-25 DIAGNOSIS — R739 Hyperglycemia, unspecified: Secondary | ICD-10-CM | POA: Diagnosis not present

## 2022-03-25 DIAGNOSIS — R42 Dizziness and giddiness: Secondary | ICD-10-CM | POA: Diagnosis not present

## 2022-04-08 ENCOUNTER — Telehealth: Payer: Self-pay

## 2022-04-08 NOTE — Telephone Encounter (Signed)
Patient would like an appointment or recommendations for vertigo that he has experienced for bout 2 weeks. Issue is non stop. Has vomited from this issue. Patient visited the ED and was given Meclizine 25 mg and Ondansetron 4 mg. Those medications only helped reduce dizziness some, but issue has continued  ?

## 2022-04-22 ENCOUNTER — Encounter: Payer: Self-pay | Admitting: Internal Medicine

## 2022-04-22 ENCOUNTER — Ambulatory Visit (INDEPENDENT_AMBULATORY_CARE_PROVIDER_SITE_OTHER): Payer: Medicaid Other | Admitting: Internal Medicine

## 2022-04-22 VITALS — BP 112/74 | HR 68 | Resp 12 | Ht 68.0 in | Wt 187.5 lb

## 2022-04-22 DIAGNOSIS — R42 Dizziness and giddiness: Secondary | ICD-10-CM | POA: Diagnosis not present

## 2022-04-22 MED ORDER — CLONAZEPAM 1 MG PO TABS: ORAL_TABLET | ORAL | 0 refills | Status: DC
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
## 2022-04-23 NOTE — Telephone Encounter (Signed)
Seen by Dr Mulberry 

## 2022-04-24 DIAGNOSIS — R260 Ataxic gait: Secondary | ICD-10-CM | POA: Diagnosis not present

## 2022-04-24 DIAGNOSIS — Z135 Encounter for screening for eye and ear disorders: Secondary | ICD-10-CM | POA: Diagnosis not present

## 2022-06-09 ENCOUNTER — Telehealth: Payer: Self-pay

## 2022-06-09 NOTE — Telephone Encounter (Signed)
Patient wanted to report that his vertigo has returned and has been much worse since his clonazepam has run out. Patient was taking this medication daily as he felt that he needed it daily. This medication ran out 1.5 weeks ago and his vertigo has since returned and affected him every day and all day.

## 2022-06-10 NOTE — Telephone Encounter (Signed)
Notify MR of brain was normal. Please find out where he was seen and for what reason this was done--assuming for vertigo, but may have been something else.

## 2022-06-11 NOTE — Telephone Encounter (Signed)
Patient was notified of results. Patient was also notified that he will not get a refill of clonazepam. Will discuss further during his upcoming appointments.

## 2022-06-24 ENCOUNTER — Ambulatory Visit (INDEPENDENT_AMBULATORY_CARE_PROVIDER_SITE_OTHER): Payer: Medicaid Other | Admitting: Internal Medicine

## 2022-06-24 ENCOUNTER — Encounter: Payer: Self-pay | Admitting: Internal Medicine

## 2022-06-24 VITALS — BP 120/82 | HR 68 | Resp 12 | Ht 68.0 in | Wt 177.0 lb

## 2022-06-24 DIAGNOSIS — R42 Dizziness and giddiness: Secondary | ICD-10-CM | POA: Diagnosis not present

## 2022-06-24 DIAGNOSIS — H9312 Tinnitus, left ear: Secondary | ICD-10-CM

## 2022-06-24 DIAGNOSIS — H9190 Unspecified hearing loss, unspecified ear: Secondary | ICD-10-CM | POA: Diagnosis not present

## 2022-06-24 MED ORDER — CLONAZEPAM 1 MG PO TABS
ORAL_TABLET | ORAL | 0 refills | Status: DC
Start: 2022-06-24 — End: 2022-10-26
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
## 2022-08-05 ENCOUNTER — Encounter: Payer: Self-pay | Admitting: Neurology

## 2022-08-05 ENCOUNTER — Ambulatory Visit (INDEPENDENT_AMBULATORY_CARE_PROVIDER_SITE_OTHER): Payer: Medicaid Other | Admitting: Neurology

## 2022-08-05 DIAGNOSIS — R292 Abnormal reflex: Secondary | ICD-10-CM | POA: Insufficient documentation

## 2022-08-05 DIAGNOSIS — R42 Dizziness and giddiness: Secondary | ICD-10-CM

## 2022-08-05 DIAGNOSIS — S0990XA Unspecified injury of head, initial encounter: Secondary | ICD-10-CM | POA: Diagnosis not present

## 2022-08-05 DIAGNOSIS — S069XAA Unspecified intracranial injury with loss of consciousness status unknown, initial encounter: Secondary | ICD-10-CM | POA: Diagnosis not present

## 2022-08-05 DIAGNOSIS — R799 Abnormal finding of blood chemistry, unspecified: Secondary | ICD-10-CM | POA: Diagnosis not present

## 2022-08-05 DIAGNOSIS — S069XAS Unspecified intracranial injury with loss of consciousness status unknown, sequela: Secondary | ICD-10-CM | POA: Insufficient documentation

## 2022-08-05 DIAGNOSIS — R270 Ataxia, unspecified: Secondary | ICD-10-CM | POA: Insufficient documentation

## 2022-08-05 DIAGNOSIS — M5030 Other cervical disc degeneration, unspecified cervical region: Secondary | ICD-10-CM | POA: Diagnosis not present

## 2022-08-05 MED ORDER — MECLIZINE HCL 12.5 MG PO TABS: 12.5000 mg | ORAL_TABLET | Freq: Three times a day (TID) | ORAL | 0 refills | Status: DC | PRN

## 2022-08-05 NOTE — Addendum Note (Signed)
Addended by: Melvyn Novas on: 08/05/2022 03:01 PM   Modules accepted: Orders
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
## 2022-08-11 ENCOUNTER — Telehealth: Payer: Self-pay

## 2022-08-11 ENCOUNTER — Other Ambulatory Visit: Payer: Self-pay

## 2022-08-11 ENCOUNTER — Encounter: Payer: Self-pay | Admitting: Physical Therapy

## 2022-08-11 ENCOUNTER — Ambulatory Visit: Payer: Medicaid Other | Attending: Neurology | Admitting: Physical Therapy

## 2022-08-11 DIAGNOSIS — R42 Dizziness and giddiness: Secondary | ICD-10-CM | POA: Diagnosis present

## 2022-08-11 DIAGNOSIS — S0990XA Unspecified injury of head, initial encounter: Secondary | ICD-10-CM | POA: Insufficient documentation

## 2022-08-11 DIAGNOSIS — R270 Ataxia, unspecified: Secondary | ICD-10-CM | POA: Diagnosis not present

## 2022-08-11 DIAGNOSIS — M5030 Other cervical disc degeneration, unspecified cervical region: Secondary | ICD-10-CM | POA: Insufficient documentation

## 2022-08-11 DIAGNOSIS — H8112 Benign paroxysmal vertigo, left ear: Secondary | ICD-10-CM | POA: Insufficient documentation

## 2022-08-11 DIAGNOSIS — R292 Abnormal reflex: Secondary | ICD-10-CM | POA: Insufficient documentation

## 2022-08-11 DIAGNOSIS — S069XAA Unspecified intracranial injury with loss of consciousness status unknown, initial encounter: Secondary | ICD-10-CM | POA: Insufficient documentation

## 2022-08-11 LAB — NEUROPATHY PANEL
A/G Ratio: 1.4 (ref 0.7–1.7)
Albumin ELP: 4.1 g/dL (ref 2.9–4.4)
Alpha 1: 0.2 g/dL (ref 0.0–0.4)
Alpha 2: 0.8 g/dL (ref 0.4–1.0)
Angio Convert Enzyme: 60 U/L (ref 14–82)
Anti Nuclear Antibody (ANA): NEGATIVE
Beta: 1 g/dL (ref 0.7–1.3)
Gamma Globulin: 1 g/dL (ref 0.4–1.8)
Globulin, Total: 2.9 g/dL (ref 2.2–3.9)
Rheumatoid fact SerPl-aCnc: 10.9 IU/mL (ref <14.0)
Sed Rate: 2 mm/hr (ref 0–30)
TSH: 0.66 u[IU]/mL (ref 0.450–4.500)
Total Protein: 7 g/dL (ref 6.0–8.5)
Vit D, 25-Hydroxy: 37.3 ng/mL (ref 30.0–100.0)
Vitamin B-12: 453 pg/mL (ref 232–1245)

## 2022-08-11 NOTE — Telephone Encounter (Signed)
-----   Message from Melvyn Novas, MD sent at 08/11/2022 12:37 PM EDT ----- All normal results for autoimmune function-

## 2022-08-11 NOTE — Telephone Encounter (Signed)
Called and relayed results to pt. Pt had no further questions at this time.

## 2022-08-11 NOTE — Progress Notes (Signed)
See telephone note from 08/11/22.

## 2022-08-12 ENCOUNTER — Telehealth: Payer: Self-pay | Admitting: Neurology

## 2022-08-12 NOTE — Telephone Encounter (Signed)
[employer] medicaid Berkley Harvey: [id number] exp. 08/12/22-09/26/22 sent to GI

## 2022-08-13 ENCOUNTER — Ambulatory Visit: Payer: Medicaid Other | Admitting: Physical Therapy

## 2022-08-13 DIAGNOSIS — H8112 Benign paroxysmal vertigo, left ear: Secondary | ICD-10-CM | POA: Diagnosis not present

## 2022-08-13 DIAGNOSIS — R42 Dizziness and giddiness: Secondary | ICD-10-CM
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
## 2022-08-14 ENCOUNTER — Encounter: Payer: Self-pay | Admitting: Physical Therapy

## 2022-08-16 ENCOUNTER — Other Ambulatory Visit: Payer: Self-pay | Admitting: Neurology

## 2022-08-16 ENCOUNTER — Ambulatory Visit
Admission: RE | Admit: 2022-08-16 | Discharge: 2022-08-16 | Disposition: A | Discharge status: Home / Self Care | Payer: Medicaid Other | Source: Ambulatory Visit | Attending: Neurology | Admitting: Neurology

## 2022-08-16 DIAGNOSIS — R292 Abnormal reflex: Secondary | ICD-10-CM

## 2022-08-16 DIAGNOSIS — S0990XA Unspecified injury of head, initial encounter: Secondary | ICD-10-CM

## 2022-08-16 DIAGNOSIS — M5030 Other cervical disc degeneration, unspecified cervical region: Secondary | ICD-10-CM

## 2022-08-16 DIAGNOSIS — R42 Dizziness and giddiness: Secondary | ICD-10-CM | POA: Diagnosis not present

## 2022-08-16 DIAGNOSIS — R270 Ataxia, unspecified: Secondary | ICD-10-CM | POA: Diagnosis not present

## 2022-08-16 DIAGNOSIS — S069XAA Unspecified intracranial injury with loss of consciousness status unknown, initial encounter: Secondary | ICD-10-CM

## 2022-08-17 ENCOUNTER — Ambulatory Visit: Payer: Medicaid Other

## 2022-08-17 ENCOUNTER — Telehealth: Payer: Self-pay | Admitting: Neurology

## 2022-08-17 DIAGNOSIS — H8112 Benign paroxysmal vertigo, left ear: Secondary | ICD-10-CM

## 2022-08-17 DIAGNOSIS — R42 Dizziness and giddiness: Secondary | ICD-10-CM

## 2022-08-17 NOTE — Telephone Encounter (Signed)
IMPRESSION: MRI scan cervical spine without contrast showing prominent spondylitic changes at C5-6 and C6/7 resulting in mild bilateral foraminal narrowing but no definite compression.

## 2022-08-17 NOTE — Progress Notes (Signed)
Dr Pearlean Brownie wrote: IMPRESSION: MRI scan cervical spine without contrast showing prominent spondylitic changes at C5-6 and C6/7 resulting in mild bilateral foraminal narrowing but no definite compression.  Patient is already enrolled in PT through PCP.  If not already obtained outside of EPIC coverage, please send this patient for an MRI brain. Melvyn Novas, MD
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
## 2022-08-18 ENCOUNTER — Encounter: Payer: Self-pay | Admitting: Neurology

## 2022-08-20 ENCOUNTER — Ambulatory Visit: Payer: Medicaid Other

## 2022-08-20 DIAGNOSIS — H8112 Benign paroxysmal vertigo, left ear: Secondary | ICD-10-CM

## 2022-08-20 DIAGNOSIS — R42 Dizziness and giddiness: Secondary | ICD-10-CM
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
## 2022-08-25 ENCOUNTER — Ambulatory Visit: Payer: Medicaid Other | Attending: Neurology | Admitting: Physical Therapy

## 2022-08-25 ENCOUNTER — Encounter: Payer: Self-pay | Admitting: Physical Therapy

## 2022-08-25 DIAGNOSIS — R42 Dizziness and giddiness: Secondary | ICD-10-CM | POA: Diagnosis not present

## 2022-08-25 DIAGNOSIS — H8112 Benign paroxysmal vertigo, left ear: Secondary | ICD-10-CM | POA: Insufficient documentation
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
## 2022-08-27 ENCOUNTER — Ambulatory Visit: Payer: Medicaid Other | Admitting: Physical Therapy

## 2022-08-27 NOTE — Telephone Encounter (Signed)
Called pt at [phone]. Called and spoke with patient about results per Dr. Oliva Bustard note. He will continue to work with PT. He will call in the future if he develops any new or worsening sx.

## 2022-09-01 ENCOUNTER — Ambulatory Visit: Payer: Medicaid Other | Admitting: Physical Therapy

## 2022-09-02 ENCOUNTER — Encounter: Payer: Self-pay | Admitting: Neurology

## 2022-09-03 ENCOUNTER — Other Ambulatory Visit: Payer: Self-pay

## 2022-09-03 ENCOUNTER — Ambulatory Visit: Payer: Medicaid Other | Admitting: Physical Therapy

## 2022-09-03 MED ORDER — IBUPROFEN 800 MG PO TABS: 800.0000 mg | ORAL_TABLET | Freq: Four times a day (QID) | ORAL | 0 refills | Status: DC | PRN

## 2022-09-08 ENCOUNTER — Ambulatory Visit: Payer: Medicaid Other

## 2022-09-09 ENCOUNTER — Encounter: Payer: Medicaid Other | Admitting: Internal Medicine

## 2022-09-10 ENCOUNTER — Ambulatory Visit: Payer: Medicaid Other

## 2022-10-08 ENCOUNTER — Other Ambulatory Visit: Payer: Self-pay

## 2022-10-08 MED ORDER — CYCLOBENZAPRINE HCL 10 MG PO TABS: ORAL_TABLET | ORAL | 0 refills | Status: DC

## 2022-10-12 ENCOUNTER — Ambulatory Visit: Payer: Medicaid Other | Attending: Neurology | Admitting: Audiologist

## 2022-10-12 DIAGNOSIS — H903 Sensorineural hearing loss, bilateral: Secondary | ICD-10-CM | POA: Insufficient documentation

## 2022-10-12 DIAGNOSIS — H9313 Tinnitus, bilateral: Secondary | ICD-10-CM | POA: Diagnosis present

## 2022-10-12 NOTE — Procedures (Signed)
  [facility] and [facility], [place]  [address] [phone]  AUDIOLOGICAL  EVALUATION  NAME: Jeffery Goodman     DOB:   Nov 17, 1961      MRN: [id number]                                                                                     DATE: 10/12/2022     REFERENT: [name], MD STATUS: Outpatient DIAGNOSIS: Sensorineural Hearing Loss, Tinnitus     History: Taysom was seen for an audiological evaluation.  [name] is receiving a hearing evaluation due to concerns for decreased hearing. [name] has difficulty hearing in background noise and the TV. This difficulty began years ago and has progressed gradually. No pain or pressure reported in either ear. Tinnitus present in both ears sounding like a high pitched hum. 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  Medical history negative for a condition which is a risk factor for hearing loss. No other relevant case history reported.   Evaluation:  Otoscopy showed a clear view of the tympanic membranes, bilaterally Tympanometry results were consistent with normal middle ear function, bilaterally   Audiometric testing was completed using conventional audiometry with insert transducer. Speech Recognition Thresholds were 24dB in the right ear and 25dB in the left ear. Word Recognition was performed 40dB SL, scored 92 % in the right ear and 84% in the left ear. Pure tone thresholds show normal sloping to moderately severe sensorineural hearing loss in each ear.  Tinnitus pitch and loudness matched to 6kHz NBN at 72dB, arounf 5dB SL. Rated 8 on 1-10 scale with 10 a perfect match. Matched by 30dB for NBN centered at 500Hz . Positive residual inhibition.   Results:  The test results were reviewed with [name] was counseled on the nature and degree of his moderately severe high pitched sensorineural hearing loss in each ear. [name]'s tinnitus  matches his worst hearing threshold. He would benefit from hearing aids with a masking protocol. He is not interested in trying hearing aids for cosmetic reasons. [name] was instructed on use of masking devices and given a handout about tinnitus.   Recommendations: Amplification is necessary for both ears. Until [name] is ready for hearing aids, recommend annual hearing tests.  Use of Bose Sleep Buds or the MyNoise App is recommended to mask tinnitus when needed.  Use of masker should be at night and when in quiet environments where the tinnitus is loud or when around triggering sound. Masker should be played at lowest level possible that provides relief from tinnitus. Earplugs recommended when around loud machinery noise.    30 minutes spent testing and counseling on results.   [name]  Audiologist, Au.D., CCC-A 10/12/2022  11:43 AM  Cc: [name], MD

## 2022-10-26 ENCOUNTER — Encounter: Payer: Self-pay | Admitting: Internal Medicine

## 2022-10-26 ENCOUNTER — Ambulatory Visit (INDEPENDENT_AMBULATORY_CARE_PROVIDER_SITE_OTHER): Payer: Medicaid Other | Admitting: Internal Medicine

## 2022-10-26 VITALS — BP 114/76 | HR 78 | Resp 16 | Ht 68.0 in | Wt 193.0 lb

## 2022-10-26 DIAGNOSIS — M25512 Pain in left shoulder: Secondary | ICD-10-CM

## 2022-10-26 DIAGNOSIS — H9190 Unspecified hearing loss, unspecified ear: Secondary | ICD-10-CM

## 2022-10-26 DIAGNOSIS — M25571 Pain in right ankle and joints of right foot: Secondary | ICD-10-CM

## 2022-10-26 DIAGNOSIS — H9312 Tinnitus, left ear: Secondary | ICD-10-CM

## 2022-10-26 DIAGNOSIS — M545 Low back pain, unspecified: Secondary | ICD-10-CM

## 2022-10-26 DIAGNOSIS — R42 Dizziness and giddiness: Secondary | ICD-10-CM | POA: Diagnosis not present

## 2022-10-26 DIAGNOSIS — M25561 Pain in right knee: Secondary | ICD-10-CM

## 2022-10-26 DIAGNOSIS — G8929 Other chronic pain: Secondary | ICD-10-CM

## 2022-10-26 DIAGNOSIS — G5603 Carpal tunnel syndrome, bilateral upper limbs: Secondary | ICD-10-CM

## 2022-10-26 MED ORDER — CYCLOBENZAPRINE HCL 10 MG PO TABS
ORAL_TABLET | ORAL | 4 refills | Status: DC
Start: 2022-10-26 — End: 2024-01-07

## 2022-10-26 NOTE — Progress Notes (Signed)
    Subjective:    Patient ID: Jeffery Goodman, male   DOB: 1961-08-20, 61 y.o.   MRN: [id number]   HPI   Vertigo:  Has been seen by Dr. Brett Fairy, neuro and finished his vestibular PT with good results.  Feels his vertigo resolved with PT, his last treatment being 08/25/22 when he was discharged for meeting therapeutic goals.  MR of cervical spine with findings at C5, C6-7 with mild bilateral foraminal narrowing without definite nerve compression.  States vertigo is gone and he is performing daily exercises given from PT.    2.  Bilateral moderately severe sensorineural hearing loss with tinnitus:  patient states his hearing is not bad enough and he does not want to wear hearing aides and look old.  Also unwilling to wear glasses.  He is wearing hearing protection around loud noises  3.  Wants handicapped sticker for his ankle:  Has aches and pains all over.  Low back pain, right knee, etc.  Not interested in any surgery yet.  Carpal tunnel syndrome on left bothering him, but not wearing cock up splint regularly.  Does not want to take any more pills.    4.  CTS, low back pain, right ankle pain.  Needs refill of cyclobenzaprine.  Not using cock up splint regularly at night.  Current Meds  Medication Sig   cyclobenzaprine (FLEXERIL) 10 MG tablet 1/2 to 1 tab by mouth daily at bedtime for muscle spasms   DULoxetine (CYMBALTA) 60 MG capsule 1 cap by mouth daily   famotidine (PEPCID) 20 MG tablet Take 20 mg by mouth as needed for heartburn or indigestion.   ibuprofen (ADVIL) 800 MG tablet Take 1 tablet (800 mg total) by mouth every 6 (six) hours as needed. 1 tablet by mouth every 6 hours with food as needed for pain   No Known Allergies   Review of Systems    Objective:   BP 114/76 (BP Location: Left Arm, Patient Position: Sitting, Cuff Size: Normal)   Pulse 78   Resp 16   Ht 5\' 8"  (1.727 m)   Wt 193 lb (87.5 kg)   BMI 29.35 kg/m   Physical Exam NAD HEENT:  PERRL, EOMI, TMs pearly  gray Neck:  Supple, No adenopathy Chest:  CTA CV:  RRR with normal S1 and S2, No S3, S4 or murmur.  No carotid bruits.  Carotid, radial and DP pulses normal and equal Abd:  S, NT, No HSM or mass, + BS Neuro:  A & O x 3, CN II-XII grossly intact.  Gait normal.   Assessment & Plan    Vertigo:  resolved with vestibular PT.  2.  Multiple orthopedic issues:  he is not interested in seeking ortho intervention, including considering possibility of Corticosteroid injection.  Does want a handicapped placard as walking a distance causes discomfort in multiple joints.  Encouraged him to use cock up splint for CTS nightly. Placard application filled out.  3.  Hearing loss with tinnitus.  No interest in intervention at this time, including noise masking ear phones.    FAsting labs in 2 weeks.  CPE in 6 months.

## 2022-11-17 ENCOUNTER — Other Ambulatory Visit (INDEPENDENT_AMBULATORY_CARE_PROVIDER_SITE_OTHER): Payer: Medicaid Other

## 2022-11-17 DIAGNOSIS — Z1159 Encounter for screening for other viral diseases: Secondary | ICD-10-CM | POA: Diagnosis not present

## 2022-11-17 DIAGNOSIS — Z125 Encounter for screening for malignant neoplasm of prostate: Secondary | ICD-10-CM

## 2022-11-17 DIAGNOSIS — E782 Mixed hyperlipidemia: Secondary | ICD-10-CM

## 2022-11-18 LAB — PSA: Prostate Specific Ag, Serum: 0.5 ng/mL (ref 0.0–4.0)

## 2022-11-18 LAB — COMPREHENSIVE METABOLIC PANEL
ALT: 12 IU/L (ref 0–44)
AST: 18 IU/L (ref 0–40)
Albumin/Globulin Ratio: 2 (ref 1.2–2.2)
Albumin: 4.6 g/dL (ref 3.8–4.9)
Alkaline Phosphatase: 63 IU/L (ref 44–121)
BUN/Creatinine Ratio: 19 (ref 9–20)
BUN: 19 mg/dL (ref 6–24)
Bilirubin Total: 0.3 mg/dL (ref 0.0–1.2)
CO2: 22 mmol/L (ref 20–29)
Calcium: 9.4 mg/dL (ref 8.7–10.2)
Chloride: 103 mmol/L (ref 96–106)
Creatinine, Ser: 1 mg/dL (ref 0.76–1.27)
Globulin, Total: 2.3 g/dL (ref 1.5–4.5)
Glucose: 73 mg/dL (ref 70–99)
Potassium: 4.4 mmol/L (ref 3.5–5.2)
Sodium: 143 mmol/L (ref 134–144)
Total Protein: 6.9 g/dL (ref 6.0–8.5)
eGFR: 87 mL/min/{1.73_m2} (ref >59)

## 2022-11-18 LAB — LIPID PANEL W/O CHOL/HDL RATIO
Cholesterol, Total: 224 mg/dL — ABNORMAL HIGH (ref 100–199)
HDL: 56 mg/dL (ref >39)
LDL Chol Calc (NIH): 146 mg/dL — ABNORMAL HIGH (ref 0–99)
Triglycerides: 123 mg/dL (ref 0–149)
VLDL Cholesterol Cal: 22 mg/dL (ref 5–40)

## 2022-11-18 LAB — HEPATITIS C ANTIBODY: Hep C Virus Ab: NONREACTIVE

## 2022-12-11 ENCOUNTER — Other Ambulatory Visit: Payer: Self-pay

## 2022-12-11 MED ORDER — IBUPROFEN 800 MG PO TABS: 800.0000 mg | ORAL_TABLET | Freq: Four times a day (QID) | ORAL | 0 refills | Status: DC | PRN

## 2023-04-02 ENCOUNTER — Other Ambulatory Visit: Payer: Self-pay | Admitting: Internal Medicine

## 2023-04-29 ENCOUNTER — Encounter: Payer: 59 | Admitting: Internal Medicine

## 2023-06-22 ENCOUNTER — Other Ambulatory Visit: Payer: Self-pay | Admitting: Internal Medicine

## 2023-07-22 ENCOUNTER — Encounter: Payer: Self-pay | Admitting: Internal Medicine

## 2023-07-22 ENCOUNTER — Ambulatory Visit (INDEPENDENT_AMBULATORY_CARE_PROVIDER_SITE_OTHER): Payer: 59 | Admitting: Internal Medicine

## 2023-07-22 VITALS — BP 120/78 | HR 68 | Resp 16 | Ht 67.5 in | Wt 185.0 lb

## 2023-07-22 DIAGNOSIS — M654 Radial styloid tenosynovitis [de Quervain]: Secondary | ICD-10-CM | POA: Diagnosis not present

## 2023-07-22 DIAGNOSIS — M5416 Radiculopathy, lumbar region: Secondary | ICD-10-CM

## 2023-07-22 DIAGNOSIS — Z0000 Encounter for general adult medical examination without abnormal findings: Secondary | ICD-10-CM

## 2023-07-22 DIAGNOSIS — Z23 Encounter for immunization: Secondary | ICD-10-CM
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
## 2023-07-29 ENCOUNTER — Encounter: Payer: 59 | Admitting: Internal Medicine

## 2023-10-22 ENCOUNTER — Other Ambulatory Visit: Payer: 59

## 2023-10-28 ENCOUNTER — Telehealth: Payer: Self-pay

## 2023-10-28 NOTE — Telephone Encounter (Signed)
Patient called Tuesday to rescheduled an appointment that he missed on 10/22/2023  but our system was down, I called patient back today, Patient asked what kind of appointment it was I inform patient it was a lab appointment.  Patient stated "I do not need that, you guys have taken out so much blood from me and I do not see a reason why"  I checked and notify patient last lab appointment he had was in November of last year, patient said "yes, I know and you do not need to get blood from me every year, I am good I do not have nothing"  Patient just stated that he will just come to his next appointment with [place] in August.

## 2024-01-05 ENCOUNTER — Other Ambulatory Visit: Payer: Self-pay | Admitting: Internal Medicine

## 2024-01-05 DIAGNOSIS — M62838 Other muscle spasm: Secondary | ICD-10-CM | POA: Diagnosis not present

## 2024-01-05 DIAGNOSIS — L299 Pruritus, unspecified: Secondary | ICD-10-CM | POA: Diagnosis not present

## 2024-01-16 ENCOUNTER — Other Ambulatory Visit: Payer: Self-pay | Admitting: Internal Medicine

## 2024-01-19 DIAGNOSIS — D539 Nutritional anemia, unspecified: Secondary | ICD-10-CM | POA: Diagnosis not present

## 2024-01-19 DIAGNOSIS — R42 Dizziness and giddiness: Secondary | ICD-10-CM | POA: Diagnosis not present

## 2024-04-06 ENCOUNTER — Other Ambulatory Visit: Payer: Self-pay | Admitting: Internal Medicine

## 2024-05-28 ENCOUNTER — Other Ambulatory Visit: Payer: Self-pay | Admitting: Internal Medicine

## 2024-07-24 ENCOUNTER — Ambulatory Visit (INDEPENDENT_AMBULATORY_CARE_PROVIDER_SITE_OTHER): Payer: 59 | Admitting: Internal Medicine

## 2024-07-24 ENCOUNTER — Encounter: Payer: Self-pay | Admitting: Internal Medicine

## 2024-07-24 VITALS — BP 132/76 | HR 66 | Resp 18 | Ht 67.0 in | Wt 182.0 lb

## 2024-07-24 DIAGNOSIS — Z59819 Housing instability, housed unspecified: Secondary | ICD-10-CM | POA: Insufficient documentation

## 2024-07-24 DIAGNOSIS — Z0000 Encounter for general adult medical examination without abnormal findings: Secondary | ICD-10-CM | POA: Diagnosis not present

## 2024-07-24 DIAGNOSIS — L905 Scar conditions and fibrosis of skin: Secondary | ICD-10-CM
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
## 2024-07-28 ENCOUNTER — Other Ambulatory Visit

## 2025-07-27 ENCOUNTER — Other Ambulatory Visit

## 2025-07-30 ENCOUNTER — Encounter: Admitting: Internal Medicine
